# Patient Record
Sex: Female | Born: 1935 | Race: White | Hispanic: No | Marital: Married | Smoking: Never smoker
Health system: Southern US, Community
[De-identification: ages and names within clinical notes are randomized; demographics above are authoritative.]

## PROBLEM LIST (undated history)

## (undated) DIAGNOSIS — C801 Malignant (primary) neoplasm, unspecified: Secondary | ICD-10-CM

## (undated) DIAGNOSIS — C786 Secondary malignant neoplasm of retroperitoneum and peritoneum: Secondary | ICD-10-CM

## (undated) DIAGNOSIS — I10 Essential (primary) hypertension: Secondary | ICD-10-CM

## (undated) HISTORY — PX: CHOLECYSTECTOMY: SHX55

## (undated) HISTORY — DX: Secondary malignant neoplasm of retroperitoneum and peritoneum: C78.6

## (undated) HISTORY — PX: ABDOMINAL HYSTERECTOMY: SHX81

## (undated) HISTORY — DX: Malignant (primary) neoplasm, unspecified: C80.1

---

## 2001-12-12 ENCOUNTER — Ambulatory Visit (HOSPITAL_COMMUNITY): Admission: RE | Admit: 2001-12-12 | Discharge: 2001-12-12 | Payer: Self-pay | Admitting: Internal Medicine

## 2001-12-12 ENCOUNTER — Encounter: Payer: Self-pay | Admitting: Internal Medicine

## 2002-11-02 ENCOUNTER — Inpatient Hospital Stay (HOSPITAL_COMMUNITY): Admission: RE | Admit: 2002-11-02 | Discharge: 2002-11-03 | Payer: Self-pay | Admitting: Internal Medicine

## 2003-12-05 ENCOUNTER — Ambulatory Visit (HOSPITAL_COMMUNITY): Admission: RE | Admit: 2003-12-05 | Discharge: 2003-12-05 | Payer: Self-pay | Admitting: Internal Medicine

## 2005-05-29 ENCOUNTER — Ambulatory Visit (HOSPITAL_COMMUNITY): Admission: RE | Admit: 2005-05-29 | Discharge: 2005-05-29 | Payer: Self-pay | Admitting: Family Medicine

## 2006-05-31 ENCOUNTER — Ambulatory Visit (HOSPITAL_COMMUNITY): Admission: RE | Admit: 2006-05-31 | Discharge: 2006-05-31 | Payer: Self-pay | Admitting: Internal Medicine

## 2006-10-26 ENCOUNTER — Ambulatory Visit (HOSPITAL_COMMUNITY): Admission: RE | Admit: 2006-10-26 | Discharge: 2006-10-26 | Payer: Self-pay | Admitting: Internal Medicine

## 2006-11-08 ENCOUNTER — Ambulatory Visit: Payer: Self-pay | Admitting: Internal Medicine

## 2006-11-08 ENCOUNTER — Encounter (INDEPENDENT_AMBULATORY_CARE_PROVIDER_SITE_OTHER): Payer: Self-pay | Admitting: *Deleted

## 2006-11-08 ENCOUNTER — Observation Stay (HOSPITAL_COMMUNITY): Admission: RE | Admit: 2006-11-08 | Discharge: 2006-11-09 | Payer: Self-pay | Admitting: Internal Medicine

## 2006-12-15 ENCOUNTER — Ambulatory Visit (HOSPITAL_COMMUNITY): Admission: RE | Admit: 2006-12-15 | Discharge: 2006-12-15 | Payer: Self-pay | Admitting: General Surgery

## 2007-06-02 ENCOUNTER — Ambulatory Visit (HOSPITAL_COMMUNITY): Admission: RE | Admit: 2007-06-02 | Discharge: 2007-06-02 | Payer: Self-pay | Admitting: Internal Medicine

## 2007-12-20 ENCOUNTER — Ambulatory Visit (HOSPITAL_COMMUNITY): Admission: RE | Admit: 2007-12-20 | Discharge: 2007-12-20 | Payer: Self-pay | Admitting: Ophthalmology

## 2008-01-03 ENCOUNTER — Ambulatory Visit (HOSPITAL_COMMUNITY): Admission: RE | Admit: 2008-01-03 | Discharge: 2008-01-03 | Payer: Self-pay | Admitting: Ophthalmology

## 2008-06-05 ENCOUNTER — Ambulatory Visit (HOSPITAL_COMMUNITY): Admission: RE | Admit: 2008-06-05 | Discharge: 2008-06-05 | Payer: Self-pay | Admitting: Internal Medicine

## 2010-04-24 ENCOUNTER — Ambulatory Visit (HOSPITAL_COMMUNITY): Admission: RE | Admit: 2010-04-24 | Discharge: 2010-04-24 | Payer: Self-pay | Admitting: Internal Medicine

## 2010-11-20 ENCOUNTER — Other Ambulatory Visit (HOSPITAL_COMMUNITY): Payer: Self-pay | Admitting: Internal Medicine

## 2010-11-20 DIAGNOSIS — Z139 Encounter for screening, unspecified: Secondary | ICD-10-CM

## 2010-11-25 ENCOUNTER — Ambulatory Visit (HOSPITAL_COMMUNITY)
Admission: RE | Admit: 2010-11-25 | Discharge: 2010-11-25 | Disposition: A | Discharge status: Home / Self Care | Payer: Medicare Other | Source: Ambulatory Visit | Attending: Internal Medicine | Admitting: Internal Medicine

## 2010-11-25 DIAGNOSIS — Z139 Encounter for screening, unspecified: Secondary | ICD-10-CM

## 2010-11-25 DIAGNOSIS — M818 Other osteoporosis without current pathological fracture: Secondary | ICD-10-CM | POA: Insufficient documentation

## 2010-12-26 NOTE — H&P (Signed)
NAME:  Shelia Vaughn, Shelia Vaughn               ACCOUNT NO.:  [id number]   MEDICAL RECORD NO.:  [id number]          PATIENT TYPE:  AMB   LOCATION:  DAY                           FACILITY:  [facility]   PHYSICIAN:  Dalia Heading, M.D.  DATE OF BIRTH:  Jan 16, 1927   DATE OF ADMISSION:  DATE OF DISCHARGE:  LH                              HISTORY & PHYSICAL   CHIEF COMPLAINT:  Cholecystitis, cholelithiasis.   HISTORY OF PRESENT ILLNESS:  The patient is a 77 year old white female  who is referred for evaluation and treatment of biliary colic secondary  to cholelithiasis.  She has been having intermittent right upper  quadrant abdominal pain, nausea, and bloating for many weeks.  She does  have fatty food intolerance.  No fever, chills, or jaundice have been  noted.   PAST MEDICAL HISTORY:  1. High cholesterol levels.  2. Hypertension.   PAST SURGICAL HISTORY:  1. Hysterectomy.  2. Bladder tack up.   CURRENT MEDICATIONS:  Lipitor, a blood pressure pill.   ALLERGIES:  NO KNOWN DRUG ALLERGIES.   REVIEW OF SYSTEMS:  The patient denies drinking or smoking.  She denies  any recent chest pain, MI, CVA, or diabetes mellitus.   PHYSICAL EXAMINATION:  GENERAL:  The patient is a well-developed, well-  nourished, white female in no acute distress.  HEENT:  Reveals no scleral icterus.  LUNGS:  Clear to auscultation with equal breath sounds bilaterally.  HEART:  Reveals a regular rate and rhythm without S3, S4, or murmurs.  ABDOMEN:  Soft and nondistended.  She is tender in the right upper  quadrant to palpation.  No hepatosplenomegaly, masses, or hernias are  identified.   Ultrasound of the gallbladder reveals cholelithiasis with a dilated  common bile duct to 10-mm, but no choledocholithiasis is seen.   IMPRESSION:  1. Cholecystitis.  2. Cholelithiasis.   PLAN:  The patient is scheduled for a laparoscopic cholecystectomy with  cholangiograms on November 08, 2006.  The risks and benefits of the  procedure including bleeding, infection, hepatobiliary injury, and the  possibly of needing an ERCP, the possibility of an open procedure were  fully explained to the patient, gave informed consent.      Dalia Heading, M.D.  Electronically Signed     MAJ/MEDQ  D:  11/01/2006  T:  11/01/2006  Job:  [id number]   cc:   [facility]  Fax: [phone]   Catalina Pizza, M.D.  Fax: [phone]

## 2010-12-26 NOTE — Discharge Summary (Signed)
   NAME:  RAIYAH, SPEAKMAN                         ACCOUNT NO.:  [id number]   MEDICAL RECORD NO.:  [id number]                   PATIENT TYPE:  INP   LOCATION:  A302                                 FACILITY:  [facility]   PHYSICIAN:  Bernerd Limbo. Leona Carry, M.D.             DATE OF BIRTH:  Sep 30, 1926   DATE OF ADMISSION:  11/02/2002  DATE OF DISCHARGE:  11/03/2002                                 DISCHARGE SUMMARY   ADMITTING DIAGNOSES:  Cystocele.   DISCHARGE DIAGNOSES:  Cystocele.   OPERATION PERFORMED:  March 25 anterior repair.   COMPLICATIONS:  None.   PROGNOSIS:  Good.   HISTORY OF PRESENT ILLNESS:  This 77 year old white female was admitted to  this hospital on the 25th of March with a complaint of a lot of pelvic  pressure and when she voided she had to push the bladder back inside.  There  was no stress incontinence; however, there was frequency and increased  nocturia.  Examination in the office did reveal moderate grade 2 cystocele.   HOSPITAL COURSE:  On the 25th of March this patient was taken to the  operating room and anterior repair was performed without event.  Postoperatively she did very well.  Foley and packing removed on the 26th of  March.  She was voiding without difficulty.  She was discharged home on this  date to be seen in one week.                                               Bernerd Limbo. Leona Carry, M.D.    NMD/MEDQ  D:  11/23/2002  T:  11/23/2002  Job:  [id number]

## 2010-12-26 NOTE — Procedures (Signed)
   NAME:  Shelia Vaughn, Shelia Vaughn                         ACCOUNT NO.:  [id number]   MEDICAL RECORD NO.:  [id number]                   PATIENT TYPE:  AMB   LOCATION:  DAY                                  FACILITY:  [facility]   PHYSICIAN:  Vida Roller, M.D.                DATE OF BIRTH:  [birth year]   DATE OF PROCEDURE:  DATE OF DISCHARGE:                                EKG INTERPRETATION   REFERRING PHYSICIAN:  Bernerd Limbo. Leona Carry, M.D.   INTERPRETATION:  Electrocardiogram was performed on October 31, 2002.  Results:  Normal sinus rhythm at a rate of 73 with normal intervals.  There  is evidence of left axis deviation and left anterior fascicular block.  The  overall R-wave progression is poor across the anterior precordium, but there  is no evidence of Q-waves or ST-segment changes consistent with ischemia.  There are no old EKGs for comparison.  There is borderline left atrial  enlargement as well.                                               Vida Roller, M.D.    JH/MEDQ  D:  10/31/2002  T:  10/31/2002  Job:  [id number]

## 2010-12-26 NOTE — Op Note (Signed)
NAMESHAWNI, Shelia Vaughn               ACCOUNT NO.:  [id number]   MEDICAL RECORD NO.:  [id number]          PATIENT TYPE:  OBV   LOCATION:  A210                          FACILITY:  [facility]   PHYSICIAN:  Dalia Heading, M.D.  DATE OF BIRTH:  23-Apr-1927   DATE OF PROCEDURE:  11/08/2006  DATE OF DISCHARGE:  11/09/2006                               OPERATIVE REPORT   PREOPERATIVE DIAGNOSIS:  Cholecystitis, cholelithiasis, dilated common  bile duct.   POSTOPERATIVE DIAGNOSIS:  Cholecystitis, cholelithiasis, dilated common  bile duct.   PROCEDURE:  Laparoscopic cholecystectomy with cholangiograms.   SURGEON:  Dr. Franky Macho.   ANESTHESIA:  General endotracheal.   INDICATIONS:  The patient is a 77 year old white female presents with  cholecystitis with cholelithiasis.  She is also noted to have a dilated  common bile duct to 10 mm with no common duct stones seen.  The patient  now comes to the operating room for laparoscopic cholecystectomy with  cholangiograms.  Risks and benefits of the procedure including bleeding,  infection, hepatobiliary injury, the possibly an open procedure were  fully explained to the patient, who gave informed consent.   PROCEDURE NOTE:  The patient was placed in supine position.  After  induction of general endotracheal anesthesia, the abdomen was prepped  and draped in the usual sterile technique with Betadine.  Surgical site  confirmation was performed.   An infraumbilical incision was made down to fascia.  Veress needle was  introduced into the abdominal cavity and confirmation of placement was  done using the saline drop test.  The abdomen was then insufflated to 16  mmHg pressure.  An 11-mm trocar was introduced into the abdominal cavity  under direct visualization without difficulty.  The patient was placed  in reverse Trendelenburg position.  An additional 11-mm trocar was  placed in epigastric region and 5-mm trocars placed in the right upper  quadrant and right flank regions.  Liver was inspected and noted to be  within normal limits.  Gallbladder was retracted superior and laterally.  The dissection was begun around the infundibulum of the gallbladder.  The cystic duct was first identified.  Its juncture to the infundibulum  fully identified.  A single Endo clip was placed proximally on the  cystic duct.  An incision was made into the cystic duct and  cholangiogram catheter was inserted.  Cholangiograms were then performed  under digital fluoroscopy.  The patient was noted to have a diffusely  dilated, hepatobiliary tree with narrowing at the distal portion of the  common bile duct.  Dye did flow into the duodenum.  No filling defects  were noted within the hepatobiliary tree.  The system was then flushed  with normal saline and the cholangiocatheter removed.  A vascular Endo-  GIA was placed across the cystic duct and fired.  The cystic artery was  ligated and divided.  The gallbladder was then freed away from the  gallbladder fossa using Bovie electrocautery.  The gallbladder delivered  through the epigastric trocar site using EndoCatch bag.  Multiple large  stones were noted within the gallbladder lumen.  The gallbladder fossa  was inspected.  No abnormal bleeding or bile leakage was noted.  Surgicel was placed in the gallbladder fossa.  All fluid and air were  then evacuated from the abdominal cavity prior to removal of the  trocars.   All wounds were irrigated normal saline.  All wounds were checked with  0.5 cm Sensorcaine.  The infraumbilical fascia was reapproximated using  a 0-0 Vicryl interrupted suture.  All skin incisions were closed using  staples.  Betadine ointment and dry sterile dressings were applied.   All tape and needle counts correct and end the procedure.  The patient  was extubated in the operating room and went back to recovery room awake  in stable condition.   COMPLICATIONS:  None.   SPECIMEN:   Gallbladder with stones.   BLOOD LOSS:  Minimal.      Dalia Heading, M.D.  Electronically Signed     MAJ/MEDQ  D:  11/08/2006  T:  11/08/2006  Job:  [id number]   cc:   Catalina Pizza, M.D.  Fax: [phone]
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
## 2010-12-26 NOTE — Op Note (Signed)
   NAME:  Shelia Vaughn, LEITZKE                         ACCOUNT NO.:  [id number]   MEDICAL RECORD NO.:  [id number]                   PATIENT TYPE:  AMB   LOCATION:  DAY                                  FACILITY:  [facility]   PHYSICIAN:  Bernerd Limbo. Leona Carry, M.D.             DATE OF BIRTH:  [birth year]   DATE OF PROCEDURE:  11/02/2002  DATE OF DISCHARGE:                                 OPERATIVE REPORT   PREOPERATIVE DIAGNOSIS:  Cystocele.   POSTOPERATIVE DIAGNOSIS:  Cystocele.   PROCEDURE:  Anterior repair.   COMPLICATIONS:  None.   SURGEON:  Bernerd Limbo. Destefano, M.D.   DESCRIPTION OF PROCEDURE:  Under adequate general anesthesia, patient  prepped and draped in the lithotomy position.  A weighted vaginal speculum  was inserted into the vagina and the anterior vaginal wall, which contained  a cystocele second degree, was grasped with Allis clamps and incised.  Dissection was carried upward to the level of the urethra, the bladder and  pubocervical fascia pushed out of harm's way, and inferiorly down to the  level of the vaginal cuff.  After the bladder pushed a sufficient distance  out of harm's way, the pubocervical fascia was reapproximated with  continuous locking 0 chromic catgut and then oversewn with interrupted 0  Vicryl.  The excess vaginal wall was excised and the anterior vaginal wall  then repaired first with a continuous locking 0 chromic catgut suture and  then oversewn with interrupted 0 Vicryl sutures.  At completion of the  procedure, all bleeding was under control.  Blood loss estimated at maybe 15  or 20 mL.  A two-inch five-yard iodoform gauze pack was inserted into the  vagina.  A Foley catheter was left in place, to be removed in the a.m.  The  urine was clear at the end of the surgery.  The patient tolerated this  procedure nicely and left the room in good condition.                                               Bernerd Limbo. Leona Carry, M.D.    NMD/MEDQ  D:  11/02/2002  T:   11/03/2002  Job:  [id number]
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
## 2011-05-06 LAB — BASIC METABOLIC PANEL
Creatinine, Ser: 0.68
GFR calc Af Amer: >60
GFR calc non Af Amer: >60
Potassium: 4

## 2011-05-06 LAB — HEMOGLOBIN AND HEMATOCRIT, BLOOD
HCT: 36.8
Hemoglobin: 12.9

## 2011-11-11 ENCOUNTER — Other Ambulatory Visit (HOSPITAL_COMMUNITY): Payer: Self-pay | Admitting: Internal Medicine

## 2011-11-11 ENCOUNTER — Ambulatory Visit (HOSPITAL_COMMUNITY)
Admission: RE | Admit: 2011-11-11 | Discharge: 2011-11-11 | Disposition: A | Discharge status: Home / Self Care | Payer: Medicare Other | Source: Ambulatory Visit | Attending: Internal Medicine | Admitting: Internal Medicine

## 2011-11-11 DIAGNOSIS — R0781 Pleurodynia: Secondary | ICD-10-CM

## 2011-11-11 DIAGNOSIS — R071 Chest pain on breathing: Secondary | ICD-10-CM | POA: Insufficient documentation

## 2011-11-11 DIAGNOSIS — I517 Cardiomegaly: Secondary | ICD-10-CM | POA: Insufficient documentation

## 2011-12-29 ENCOUNTER — Other Ambulatory Visit (HOSPITAL_COMMUNITY): Payer: Self-pay | Admitting: Internal Medicine

## 2011-12-29 ENCOUNTER — Encounter (HOSPITAL_COMMUNITY): Payer: Self-pay

## 2011-12-29 ENCOUNTER — Ambulatory Visit (HOSPITAL_COMMUNITY)
Admission: RE | Admit: 2011-12-29 | Discharge: 2011-12-29 | Disposition: A | Discharge status: Home / Self Care | Payer: Medicare Other | Source: Ambulatory Visit | Attending: Internal Medicine | Admitting: Internal Medicine

## 2011-12-29 DIAGNOSIS — R197 Diarrhea, unspecified: Secondary | ICD-10-CM

## 2011-12-29 DIAGNOSIS — K5730 Diverticulosis of large intestine without perforation or abscess without bleeding: Secondary | ICD-10-CM | POA: Insufficient documentation

## 2011-12-29 HISTORY — DX: Essential (primary) hypertension: I10

## 2012-03-29 ENCOUNTER — Other Ambulatory Visit: Payer: Self-pay | Admitting: Obstetrics & Gynecology

## 2013-01-20 ENCOUNTER — Other Ambulatory Visit (HOSPITAL_COMMUNITY): Payer: Self-pay | Admitting: Internal Medicine

## 2013-01-20 DIAGNOSIS — M810 Age-related osteoporosis without current pathological fracture: Secondary | ICD-10-CM

## 2013-01-24 ENCOUNTER — Ambulatory Visit (HOSPITAL_COMMUNITY)
Admission: RE | Admit: 2013-01-24 | Discharge: 2013-01-24 | Disposition: A | Discharge status: Home / Self Care | Payer: Medicare Other | Source: Ambulatory Visit | Attending: Internal Medicine | Admitting: Internal Medicine

## 2013-01-24 DIAGNOSIS — M818 Other osteoporosis without current pathological fracture: Secondary | ICD-10-CM | POA: Insufficient documentation

## 2013-01-24 DIAGNOSIS — M810 Age-related osteoporosis without current pathological fracture: Secondary | ICD-10-CM

## 2014-10-23 DIAGNOSIS — H524 Presbyopia: Secondary | ICD-10-CM | POA: Diagnosis not present

## 2014-10-23 DIAGNOSIS — Z961 Presence of intraocular lens: Secondary | ICD-10-CM | POA: Diagnosis not present

## 2014-10-23 DIAGNOSIS — H4011X1 Primary open-angle glaucoma, mild stage: Secondary | ICD-10-CM | POA: Diagnosis not present

## 2014-10-23 DIAGNOSIS — H52203 Unspecified astigmatism, bilateral: Secondary | ICD-10-CM | POA: Diagnosis not present

## 2015-01-23 DIAGNOSIS — E782 Mixed hyperlipidemia: Secondary | ICD-10-CM | POA: Diagnosis not present

## 2015-01-23 DIAGNOSIS — I10 Essential (primary) hypertension: Secondary | ICD-10-CM | POA: Diagnosis not present

## 2015-01-25 DIAGNOSIS — I10 Essential (primary) hypertension: Secondary | ICD-10-CM | POA: Diagnosis not present

## 2015-01-25 DIAGNOSIS — E782 Mixed hyperlipidemia: Secondary | ICD-10-CM | POA: Diagnosis not present

## 2015-02-19 DIAGNOSIS — H4011X1 Primary open-angle glaucoma, mild stage: Secondary | ICD-10-CM | POA: Diagnosis not present

## 2015-05-30 DIAGNOSIS — Z23 Encounter for immunization: Secondary | ICD-10-CM | POA: Diagnosis not present

## 2015-07-29 DIAGNOSIS — M810 Age-related osteoporosis without current pathological fracture: Secondary | ICD-10-CM | POA: Diagnosis not present

## 2015-07-29 DIAGNOSIS — I10 Essential (primary) hypertension: Secondary | ICD-10-CM | POA: Diagnosis not present

## 2015-07-29 DIAGNOSIS — E782 Mixed hyperlipidemia: Secondary | ICD-10-CM | POA: Diagnosis not present

## 2015-08-01 DIAGNOSIS — I10 Essential (primary) hypertension: Secondary | ICD-10-CM | POA: Diagnosis not present

## 2015-08-01 DIAGNOSIS — S93402A Sprain of unspecified ligament of left ankle, initial encounter: Secondary | ICD-10-CM | POA: Diagnosis not present

## 2015-08-01 DIAGNOSIS — E782 Mixed hyperlipidemia: Secondary | ICD-10-CM | POA: Diagnosis not present

## 2015-10-31 DIAGNOSIS — R0981 Nasal congestion: Secondary | ICD-10-CM | POA: Diagnosis not present

## 2015-10-31 DIAGNOSIS — H6503 Acute serous otitis media, bilateral: Secondary | ICD-10-CM | POA: Diagnosis not present

## 2016-01-07 DIAGNOSIS — R0981 Nasal congestion: Secondary | ICD-10-CM | POA: Diagnosis not present

## 2016-01-07 DIAGNOSIS — H6503 Acute serous otitis media, bilateral: Secondary | ICD-10-CM | POA: Diagnosis not present

## 2016-02-03 DIAGNOSIS — E782 Mixed hyperlipidemia: Secondary | ICD-10-CM | POA: Diagnosis not present

## 2016-02-03 DIAGNOSIS — I10 Essential (primary) hypertension: Secondary | ICD-10-CM | POA: Diagnosis not present

## 2016-02-05 DIAGNOSIS — R0981 Nasal congestion: Secondary | ICD-10-CM | POA: Diagnosis not present

## 2016-02-05 DIAGNOSIS — H6503 Acute serous otitis media, bilateral: Secondary | ICD-10-CM | POA: Diagnosis not present

## 2016-02-05 DIAGNOSIS — H906 Mixed conductive and sensorineural hearing loss, bilateral: Secondary | ICD-10-CM | POA: Diagnosis not present

## 2016-03-24 DIAGNOSIS — M79672 Pain in left foot: Secondary | ICD-10-CM | POA: Diagnosis not present

## 2016-06-02 DIAGNOSIS — Z23 Encounter for immunization: Secondary | ICD-10-CM | POA: Diagnosis not present

## 2016-06-30 DIAGNOSIS — H52203 Unspecified astigmatism, bilateral: Secondary | ICD-10-CM | POA: Diagnosis not present

## 2016-06-30 DIAGNOSIS — H5213 Myopia, bilateral: Secondary | ICD-10-CM | POA: Diagnosis not present

## 2016-06-30 DIAGNOSIS — Z961 Presence of intraocular lens: Secondary | ICD-10-CM | POA: Diagnosis not present

## 2016-06-30 DIAGNOSIS — H524 Presbyopia: Secondary | ICD-10-CM | POA: Diagnosis not present

## 2016-06-30 DIAGNOSIS — H401131 Primary open-angle glaucoma, bilateral, mild stage: Secondary | ICD-10-CM | POA: Diagnosis not present

## 2016-08-05 DIAGNOSIS — E782 Mixed hyperlipidemia: Secondary | ICD-10-CM | POA: Diagnosis not present

## 2016-08-05 DIAGNOSIS — I10 Essential (primary) hypertension: Secondary | ICD-10-CM | POA: Diagnosis not present

## 2016-08-07 DIAGNOSIS — E782 Mixed hyperlipidemia: Secondary | ICD-10-CM | POA: Diagnosis not present

## 2016-08-07 DIAGNOSIS — Z0001 Encounter for general adult medical examination with abnormal findings: Secondary | ICD-10-CM | POA: Diagnosis not present

## 2016-08-07 DIAGNOSIS — I10 Essential (primary) hypertension: Secondary | ICD-10-CM | POA: Diagnosis not present

## 2016-10-06 DIAGNOSIS — H401131 Primary open-angle glaucoma, bilateral, mild stage: Secondary | ICD-10-CM | POA: Diagnosis not present

## 2017-02-02 DIAGNOSIS — E782 Mixed hyperlipidemia: Secondary | ICD-10-CM | POA: Diagnosis not present

## 2017-02-02 DIAGNOSIS — I10 Essential (primary) hypertension: Secondary | ICD-10-CM | POA: Diagnosis not present

## 2017-04-13 DIAGNOSIS — I10 Essential (primary) hypertension: Secondary | ICD-10-CM | POA: Diagnosis not present

## 2017-04-13 DIAGNOSIS — R110 Nausea: Secondary | ICD-10-CM | POA: Diagnosis not present

## 2017-04-13 DIAGNOSIS — R197 Diarrhea, unspecified: Secondary | ICD-10-CM | POA: Diagnosis not present

## 2017-04-19 ENCOUNTER — Other Ambulatory Visit (HOSPITAL_COMMUNITY): Payer: Self-pay | Admitting: Internal Medicine

## 2017-04-19 ENCOUNTER — Ambulatory Visit (HOSPITAL_COMMUNITY)
Admission: RE | Admit: 2017-04-19 | Discharge: 2017-04-19 | Disposition: A | Discharge status: Home / Self Care | Payer: Medicare Other | Source: Ambulatory Visit | Attending: Internal Medicine | Admitting: Internal Medicine

## 2017-04-19 DIAGNOSIS — R932 Abnormal findings on diagnostic imaging of liver and biliary tract: Secondary | ICD-10-CM | POA: Diagnosis not present

## 2017-04-19 DIAGNOSIS — R188 Other ascites: Secondary | ICD-10-CM | POA: Insufficient documentation

## 2017-04-19 DIAGNOSIS — R109 Unspecified abdominal pain: Secondary | ICD-10-CM | POA: Insufficient documentation

## 2017-04-19 DIAGNOSIS — J90 Pleural effusion, not elsewhere classified: Secondary | ICD-10-CM | POA: Diagnosis not present

## 2017-04-19 DIAGNOSIS — R110 Nausea: Secondary | ICD-10-CM | POA: Diagnosis not present

## 2017-04-19 DIAGNOSIS — I10 Essential (primary) hypertension: Secondary | ICD-10-CM | POA: Diagnosis not present

## 2017-04-19 DIAGNOSIS — R197 Diarrhea, unspecified: Secondary | ICD-10-CM | POA: Diagnosis not present

## 2017-04-19 DIAGNOSIS — D4989 Neoplasm of unspecified behavior of other specified sites: Secondary | ICD-10-CM | POA: Insufficient documentation

## 2017-04-19 LAB — POCT I-STAT CREATININE: CREATININE: 1 mg/dL (ref 0.44–1.00)

## 2017-04-19 MED ORDER — IOPAMIDOL (ISOVUE-300) INJECTION 61%
100.0000 mL | Freq: Once | INTRAVENOUS | Status: AC | PRN
  Administered 2017-04-19: 100 mL via INTRAVENOUS

## 2017-04-20 DIAGNOSIS — J90 Pleural effusion, not elsewhere classified: Secondary | ICD-10-CM | POA: Diagnosis not present

## 2017-04-20 DIAGNOSIS — R1907 Generalized intra-abdominal and pelvic swelling, mass and lump: Secondary | ICD-10-CM | POA: Diagnosis not present

## 2017-04-20 DIAGNOSIS — R110 Nausea: Secondary | ICD-10-CM | POA: Diagnosis not present

## 2017-04-20 DIAGNOSIS — R1084 Generalized abdominal pain: Secondary | ICD-10-CM | POA: Diagnosis not present

## 2017-04-21 ENCOUNTER — Ambulatory Visit (HOSPITAL_COMMUNITY)
Admission: RE | Admit: 2017-04-21 | Discharge: 2017-04-21 | Disposition: A | Discharge status: Home / Self Care | Payer: Medicare Other | Source: Ambulatory Visit | Attending: Oncology | Admitting: Oncology

## 2017-04-21 ENCOUNTER — Other Ambulatory Visit (HOSPITAL_COMMUNITY): Payer: Self-pay | Admitting: Oncology

## 2017-04-21 ENCOUNTER — Encounter (HOSPITAL_COMMUNITY): Payer: Self-pay

## 2017-04-21 ENCOUNTER — Encounter (HOSPITAL_COMMUNITY): Payer: Medicare Other

## 2017-04-21 ENCOUNTER — Encounter (HOSPITAL_COMMUNITY): Payer: Medicare Other | Attending: Oncology | Admitting: Oncology

## 2017-04-21 DIAGNOSIS — R911 Solitary pulmonary nodule: Secondary | ICD-10-CM

## 2017-04-21 DIAGNOSIS — J984 Other disorders of lung: Secondary | ICD-10-CM | POA: Insufficient documentation

## 2017-04-21 DIAGNOSIS — M4854XA Collapsed vertebra, not elsewhere classified, thoracic region, initial encounter for fracture: Secondary | ICD-10-CM | POA: Insufficient documentation

## 2017-04-21 DIAGNOSIS — J479 Bronchiectasis, uncomplicated: Secondary | ICD-10-CM | POA: Insufficient documentation

## 2017-04-21 DIAGNOSIS — C801 Malignant (primary) neoplasm, unspecified: Secondary | ICD-10-CM | POA: Diagnosis not present

## 2017-04-21 DIAGNOSIS — J90 Pleural effusion, not elsewhere classified: Secondary | ICD-10-CM

## 2017-04-21 DIAGNOSIS — R599 Enlarged lymph nodes, unspecified: Secondary | ICD-10-CM | POA: Diagnosis not present

## 2017-04-21 DIAGNOSIS — C786 Secondary malignant neoplasm of retroperitoneum and peritoneum: Secondary | ICD-10-CM

## 2017-04-21 DIAGNOSIS — R188 Other ascites: Secondary | ICD-10-CM | POA: Insufficient documentation

## 2017-04-21 DIAGNOSIS — R110 Nausea: Secondary | ICD-10-CM | POA: Diagnosis not present

## 2017-04-21 DIAGNOSIS — R1900 Intra-abdominal and pelvic swelling, mass and lump, unspecified site: Secondary | ICD-10-CM

## 2017-04-21 DIAGNOSIS — R109 Unspecified abdominal pain: Secondary | ICD-10-CM | POA: Diagnosis not present

## 2017-04-21 HISTORY — DX: Secondary malignant neoplasm of retroperitoneum and peritoneum: C78.6

## 2017-04-21 LAB — POCT I-STAT CREATININE: Creatinine, Ser: 1.6 mg/dL — ABNORMAL HIGH (ref 0.44–1.00)

## 2017-04-21 MED ORDER — IOPAMIDOL (ISOVUE-300) INJECTION 61%: 75.0000 mL | Freq: Once | INTRAVENOUS | Status: DC | PRN

## 2017-04-21 NOTE — Patient Instructions (Addendum)
[place] [facility] Discharge Instructions  RECOMMENDATIONS MADE BY THE CONSULTANT AND ANY TEST RESULTS WILL BE SENT TO YOUR REFERRING PHYSICIAN.  You were seen today by Dr. Twana First We will refer you to Dr. Denman George in [place] We will get you scheduled for STAT CT scan Lab work today We will call you with the results.   Thank you for choosing [place] [facility] to provide your oncology and hematology care.  To afford each patient quality time with our provider, please arrive at least 15 minutes before your scheduled appointment time.    If you have a lab appointment with the [facility] please come in thru the  Main Entrance and check in at the main information desk  You need to re-schedule your appointment should you arrive 10 or more minutes late.  We strive to give you quality time with our providers, and arriving late affects you and other patients whose appointments are after yours.  Also, if you no show three or more times for appointments you may be dismissed from the clinic at the providers discretion.     Again, thank you for choosing [facility]  Our hope is that these requests will decrease the amount of time that you wait before being seen by our physicians.       _____________________________________________________________  Should you have questions after your visit to [facility], please contact our office at [phone] between the hours of 8:30 a.m. and 4:30 p.m.  Voicemails left after 4:30 p.m. will not be returned until the following business day.  For prescription refill requests, have your pharmacy contact our office.       Resources For Cancer Patients and their Caregivers ? American Cancer Society: Can assist with transportation, wigs, general needs, runs Look Good Feel Better.        201-527-8780 ? Cancer Care: Provides financial assistance, online support groups,  medication/co-pay assistance.  1-800-813-HOPE (306)184-5131) ? [place] Assists [place] cancer patients and their families through emotional , educational and financial support.  403-614-3053 ? [place] DSS Where to apply for food stamps, Medicaid and utility assistance. (918)222-5307 ? [facility]: Transportation to medical appointments. (984)108-9697 ? Social Security Administration: May apply for disability if have a Stage IV cancer. 7702111000 971-332-4965 ? [employer], Disability and Transit Services: Assists with nutrition, care and transit needs. [place] Support Programs: @10RELATIVEDAYS @ > Cancer Support Group  2nd Tuesday of the month 1pm-2pm, Journey Room  > Creative Journey  3rd Tuesday of the month 1130am-1pm, Journey Room  > Look Good Feel Better  1st Wednesday of the month 10am-12 noon, Journey Room (Call [place] to register [phone])

## 2017-04-21 NOTE — Progress Notes (Signed)
[facility] Initial Visit:  Patient Care Team: Celene Squibb, MD as PCP - General (Internal Medicine)  CHIEF COMPLAINTS/PURPOSE OF CONSULTATION:  Abdominal mass/peritoneal carcinomatosis  HISTORY OF PRESENTING ILLNESS: Shelia Vaughn 83 y.o. female presents with her family today for large abdominal mass. Patient had been having left-sided abdominal pain as well as nausea for the past 3 weeks. She went to see her primary care Dr. Nevada Crane who ordered a CT abdomen pelvis with contrast on 04/19/2017 which demonstrated a large abdominal mesenteric mass with a maximum dimension of 16.8 cm and involves the portal venous confluence and superior mesenteric vein, ascites and diffuse peritoneal nodularity with omental caking compatible with carcinomatosis, evidence of thoracic involvement with bilateral pleural effusions and a soft tissue nodularity overlying the right lung as well as enlarged left cardiophrenic lymph nodes. There was also dilatation and intrahepatic bile ducts which may reflect mass effect upon the distal common bile duct from the large mesenteric mass.   Patient states that she previously had a hysterectomy in her 60s and thinks she had her ovaries removed but is not completely sure. She has been having decreased appetite for the past few weeks due to nausea every time she eats. She's also had uncontrollable diarrhea for the past one week. She feels very fatigued and has no energy. Patient was given Zofran 4 mg by mouth every 6 hours for her nausea by her primary care physician. She also is has intermittent abdominal pain for which she takes Aleve and she states it controls her symptoms.  Review of Systems - Oncology ROS as per HPI otherwise 12 point ROS is negative.  MEDICAL HISTORY: Past Medical History:  Diagnosis Date  . Hypertension   . 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  ABDOMINAL HYSTERECTOMY    . CHOLECYSTECTOMY      SOCIAL HISTORY: Social History   Social History  . Marital status: Married    Spouse name: N/A  . Number of children: N/A  . Years of education: N/A   Occupational History  . Not on file.   Social History Main Topics  . Smoking status: Never Smoker  . Smokeless tobacco: Never Used  . Alcohol use No     Comment: drank in her earlier years socially  . Drug use: No  . Sexual activity: No   Other Topics Concern  . Not on file   Social History Narrative  . No narrative on file    FAMILY HISTORY History reviewed. No pertinent family history.  ALLERGIES:  is allergic to latex.  MEDICATIONS:  Current Outpatient Prescriptions  Medication Sig Dispense Refill  . Alendronate Sodium (FOSAMAX PO) Take 1 tablet by mouth every 30 (thirty) days.    Marland Kitchen amLODipine (NORVASC) 5 MG tablet     . BIOTIN PO Take 1 tablet by mouth daily.    Marland Kitchen CALCIUM PO Take 1 tablet by mouth daily.    . cholecalciferol (VITAMIN D) 1000 units tablet Take 1,000 Units by mouth daily.    Marland Kitchen GLUCOSAMINE-CHONDROITIN PO Take 1 tablet by mouth daily.    . nebivolol (BYSTOLIC) 5 MG tablet     . Omega-3 Fatty Acids (FISH OIL PO) Take 1 tablet by mouth daily.    Marland Kitchen ONDANSETRON PO Take 1 tablet by mouth as needed.    . Probiotic Product (PROBIOTIC PO) Take 1 tablet by mouth daily.    . risedronate (ACTONEL)  150 MG tablet     . telmisartan (MICARDIS) 80 MG tablet     . Travoprost, BAK Free, (TRAVATAN Z) 0.004 % SOLN ophthalmic solution INSTILL ONE DROP INTO BOTH EYES AT BEDTIME AS DIRECTED    . VITAMIN E PO Take 1 tablet by mouth daily.     No current facility-administered medications for this visit.     PHYSICAL EXAMINATION:  ECOG PERFORMANCE STATUS: 1 - Symptomatic but completely ambulatory   Vitals:   04/21/17 1405  BP: (!) 128/58  Pulse: 74  Resp: 16  Temp: 98.8 F (37.1 C)  SpO2: 97%    Filed Weights   04/21/17 1405  Weight: 142 lb 6.4 oz (64.6  kg)     Physical Exam Constitutional: Well-developed, well-nourished, and in no distress, appears younger than stated age HENT:  Head: Normocephalic and atraumatic. Hard of hearing and wears hearing aid. Mouth/Throat: No oropharyngeal exudate. Mucosa moist. Eyes: Pupils are equal, round, and reactive to light. Conjunctivae are normal. No scleral icterus.  Neck: Normal range of motion. Neck supple. No JVD present.  Cardiovascular: Normal rate, regular rhythm and normal heart sounds.  Exam reveals no gallop and no friction rub.   No murmur heard. Pulmonary/Chest: Effort normal and breath sounds normal. No respiratory distress. No wheezes.No rales.  Abdominal: Soft. Bowel sounds are normal. +distended. +firmness in RLQ with tenderness to palpation. Musculoskeletal: No edema or tenderness.  Lymphadenopathy:    No cervical or supraclavicular adenopathy.  Neurological: Alert and oriented to person, place, and time. No cranial nerve deficit.  Skin: Skin is warm and dry. No rash noted. No erythema. No pallor.  Psychiatric: Affect and judgment normal.    LABORATORY DATA: I have personally reviewed the data as listed:  Hospital Outpatient Visit on 04/19/2017  Component Date Value Ref Range Status  . Creatinine, Ser 04/19/2017 1.00  0.44 - 1.00 mg/dL Final    RADIOGRAPHIC STUDIES: I have personally reviewed the radiological images as listed and agree with the findings in the report  Ct Abdomen Pelvis W Contrast  Result Date: 04/19/2017 CLINICAL DATA:  Left-sided abdominal pain and nausea for 3 weeks. EXAM: CT ABDOMEN AND PELVIS WITH CONTRAST TECHNIQUE: Multidetector CT imaging of the abdomen and pelvis was performed using the standard protocol following bolus administration of intravenous contrast. CONTRAST:  128mL ISOVUE-300 IOPAMIDOL (ISOVUE-300) INJECTION 61% COMPARISON: 12/29/2011 FINDINGS: Lower chest: There are small bilateral pleural effusions identified right greater than left. There  is extensive areas of emphysema within both lower lobes as well as cylindrical and cystic bronchiectasis. Several several pleural based lesions are identified overlying the right lung. Index lesion anteriorly measures 4.2 x 2.8 cm. Medial pleural base mass measures 2.4 cm. Within the left cardiophrenic angle there are 2 soft tissue nodules which measure up to 2.1 cm, image 17 of series 2. Hepatobiliary: There it previous cholecystectomy. The common bile duct appears mildly increased in caliber measuring up to 9 mm. Pancreas: Unremarkable. No pancreatic ductal dilatation or surrounding inflammatory changes. Spleen: Calcified granulomas identified. Adrenals/Urinary Tract: Normal appearance of the adrenal glands. Right kidney cysts noted. Unremarkable appearance of the left kidney. Urinary bladder appears normal. Stomach/Bowel: Stomach appears normal. There is no pathologic dilatation of the large or small bowel loops. Vascular/Lymphatic: Aortic atherosclerosis. No aneurysm. No enlarged retroperitoneal lymph nodes. No pelvic or inguinal adenopathy. Reproductive: Previous hysterectomy. Other: There is a large solid mass within the central mesenteric which extends from the level of the portal venous confluence into the right iliac fossa.  This measures 7.5 by 7.7 by 16.8 cm. The portal venous confluence is partially encased. The superior mesenteric vein is completely encased but appears opacified. There is a moderate volume of ascites identified within the abdomen or pelvis. Extensive peritoneal carcinomatosis with omental caking is identified. Musculoskeletal: Anterolisthesis of L5 on S1 noted. There is degenerative disc disease noted at L4-5 and L5-S1. IMPRESSION: 1. Exam is positive for large abdominal mesenteric mass which has a maximum dimension of 16.8 cm and involves the portal venous confluence and superior mesenteric pain. 2. Ascites and diffuse peritoneal nodularity with omental caking compatible with  carcinomatosis. 3. Evidence of thoracic tumor involvement with bilateral pleural effusions and soft tissue nodularity overlying the right lung. There also enlarged left cardiophrenic lymph nodes. 4. There is dilatation of the intrahepatic bile ducts. Findings may reflect mass effect upon the distal common bile duct from the large mesenteric mass. Electronically Signed   By: Kerby Moors M.D.   On: 04/19/2017 14:28  CT ABDOMEN PELVIS W CONTRAST (Accession [id number]) (Order [id number])  Imaging  Date: 04/19/2017 Department: [facility] Released By: Alanda Slim Authorizing: Celene Squibb, MD  Exam Information   Status Exam Begun  Exam Ended   Final [99] 04/19/2017 1:31 PM 04/19/2017 2:03 PM  PACS Images   Show images for CT ABDOMEN PELVIS W CONTRAST  Study Result   CLINICAL DATA:  Left-sided abdominal pain and nausea for 3 weeks.  EXAM: CT ABDOMEN AND PELVIS WITH CONTRAST  TECHNIQUE: Multidetector CT imaging of the abdomen and pelvis was performed using the standard protocol following bolus administration of intravenous contrast.  CONTRAST:  128mL ISOVUE-300 IOPAMIDOL (ISOVUE-300) INJECTION 61%  COMPARISON: 12/29/2011  FINDINGS: Lower chest: There are small bilateral pleural effusions identified right greater than left. There is extensive areas of emphysema within both lower lobes as well as cylindrical and cystic bronchiectasis. Several several pleural based lesions are identified overlying the right lung. Index lesion anteriorly measures 4.2 x 2.8 cm. Medial pleural base mass measures 2.4 cm. Within the left cardiophrenic angle there are 2 soft tissue nodules which measure up to 2.1 cm, image 17 of series 2.  Hepatobiliary: There it previous cholecystectomy. The common bile duct appears mildly increased in caliber measuring up to 9 mm.  Pancreas: Unremarkable. No pancreatic ductal dilatation or surrounding inflammatory changes.  Spleen: Calcified  granulomas identified.  Adrenals/Urinary Tract: Normal appearance of the adrenal glands. Right kidney cysts noted. Unremarkable appearance of the left kidney. Urinary bladder appears normal.  Stomach/Bowel: Stomach appears normal. There is no pathologic dilatation of the large or small bowel loops.  Vascular/Lymphatic: Aortic atherosclerosis. No aneurysm. No enlarged retroperitoneal lymph nodes. No pelvic or inguinal adenopathy.  Reproductive: Previous hysterectomy.  Other: There is a large solid mass within the central mesenteric which extends from the level of the portal venous confluence into the right iliac fossa. This measures 7.5 by 7.7 by 16.8 cm. The portal venous confluence is partially encased. The superior mesenteric vein is completely encased but appears opacified. There is a moderate volume of ascites identified within the abdomen or pelvis. Extensive peritoneal carcinomatosis with omental caking is identified.  Musculoskeletal: Anterolisthesis of L5 on S1 noted. There is degenerative disc disease noted at L4-5 and L5-S1.  IMPRESSION: 1. Exam is positive for large abdominal mesenteric mass which has a maximum dimension of 16.8 cm and involves the portal venous confluence and superior mesenteric pain. 2. Ascites and diffuse peritoneal nodularity with omental caking compatible with carcinomatosis. 3.  Evidence of thoracic tumor involvement with bilateral pleural effusions and soft tissue nodularity overlying the right lung. There also enlarged left cardiophrenic lymph nodes. 4. There is dilatation of the intrahepatic bile ducts. Findings may reflect mass effect upon the distal common bile duct from the large mesenteric mass.     ASSESSMENT: 83 year old female with large 16.8 cm mesenteric mass with omental caking/peritoneal carcinomatosis, bilateral pleural effusions, and pleural nodules concerning for metastatic ovarian cancer.  PLAN: -Reviewed CT  abd/pelvis in detail with the patient and her family today. I have expressed my concern that she likely has a stage IV GYN malignancy. She will need definitive tissue diagnosis with biopsy. I will send patient to see a GYN-oncologist, Dr. Denman George, for her evaluation and treatment recommendations.  -I have ordered a CT chest with contrast to complete staging. -Ordered CA 125. -Recommended for her to start drinking Ensure/Boost to improve her nutritional status. -Advised her that she can take her zofran 4 mg 1-2 tabs PO q4hPRN nausea. -She does not need any pain meds at this time.  -RTC in 3 weeks for follow up.  Orders Placed This Encounter  Procedures  . CT Chest W Contrast    Standing Status:   Future    Standing Expiration Date:   04/21/2018    Order Specific Question:   If indicated for the ordered procedure, I authorize the administration of contrast media per Radiology protocol    Answer:   Yes    Order Specific Question:   Preferred imaging location?    Answer:   [facility]    Order Specific Question:   Radiology Contrast Protocol - do NOT remove file path    Answer:   [email or web address]    Order Specific Question:   Reason for Exam additional comments    Answer:   patient with pleural lesions, b/l pleural effusions, abdominal mass, staging scan for suspected GYN malignancy  . CA 125    Standing Status:   Future    Standing Expiration Date:   04/21/2018    All questions were answered. The patient knows to call the clinic with any problems, questions or concerns.  This note was electronically signed.    Twana First, MD  04/21/2017 2:37 PM

## 2017-04-22 ENCOUNTER — Telehealth: Payer: Self-pay

## 2017-04-22 LAB — CA 125: Cancer Antigen (CA) 125: 313.4 U/mL — ABNORMAL HIGH (ref 0.0–38.1)

## 2017-04-22 NOTE — Telephone Encounter (Signed)
Pt.'s family calling to find out where she needs to be brought as far as hospitals prior to her visit 04-30-17 with Dr. Denman George if her condition declines.  Patient is weak.   Told family member that she would need to be brought to [facility] as Dr. Talbert Cage is familiar with her case per  Joylene John, NP.  Family member verbalized understanding.

## 2017-04-26 ENCOUNTER — Emergency Department (HOSPITAL_COMMUNITY): Payer: Medicare Other

## 2017-04-26 ENCOUNTER — Emergency Department (HOSPITAL_COMMUNITY)
Admission: EM | Admit: 2017-04-26 | Discharge: 2017-04-26 | Disposition: A | Discharge status: Home / Self Care | Payer: Medicare Other | Attending: Emergency Medicine | Admitting: Emergency Medicine

## 2017-04-26 ENCOUNTER — Encounter (HOSPITAL_COMMUNITY): Payer: Self-pay | Admitting: Emergency Medicine

## 2017-04-26 DIAGNOSIS — I10 Essential (primary) hypertension: Secondary | ICD-10-CM | POA: Diagnosis not present

## 2017-04-26 DIAGNOSIS — R109 Unspecified abdominal pain: Secondary | ICD-10-CM | POA: Diagnosis not present

## 2017-04-26 DIAGNOSIS — R0602 Shortness of breath: Secondary | ICD-10-CM | POA: Diagnosis present

## 2017-04-26 DIAGNOSIS — R1013 Epigastric pain: Secondary | ICD-10-CM

## 2017-04-26 DIAGNOSIS — R112 Nausea with vomiting, unspecified: Secondary | ICD-10-CM | POA: Diagnosis not present

## 2017-04-26 DIAGNOSIS — Z79899 Other long term (current) drug therapy: Secondary | ICD-10-CM | POA: Diagnosis not present

## 2017-04-26 LAB — CBC WITH DIFFERENTIAL/PLATELET
BASOS PCT: 1 %
Basophils Absolute: 0.1 10*3/uL (ref 0.0–0.1)
EOS PCT: 2 %
Eosinophils Absolute: 0.1 10*3/uL (ref 0.0–0.7)
HEMATOCRIT: 34.3 % — AB (ref 36.0–46.0)
Hemoglobin: 11.6 g/dL — ABNORMAL LOW (ref 12.0–15.0)
Lymphocytes Relative: 14 %
Lymphs Abs: 1.1 10*3/uL (ref 0.7–4.0)
MCH: 30.4 pg (ref 26.0–34.0)
MCHC: 33.8 g/dL (ref 30.0–36.0)
MCV: 90 fL (ref 78.0–100.0)
MONO ABS: 0.7 10*3/uL (ref 0.1–1.0)
MONOS PCT: 9 %
NEUTROS ABS: 5.7 10*3/uL (ref 1.7–7.7)
Neutrophils Relative %: 74 %
Platelets: 364 10*3/uL (ref 150–400)
RBC: 3.81 MIL/uL — ABNORMAL LOW (ref 3.87–5.11)
RDW: 13.2 % (ref 11.5–15.5)
WBC: 7.7 10*3/uL (ref 4.0–10.5)

## 2017-04-26 LAB — COMPREHENSIVE METABOLIC PANEL
ALT: 14 U/L (ref 14–54)
ANION GAP: 10 (ref 5–15)
AST: 28 U/L (ref 15–41)
Albumin: 3.5 g/dL (ref 3.5–5.0)
Alkaline Phosphatase: 66 U/L (ref 38–126)
BILIRUBIN TOTAL: 1.1 mg/dL (ref 0.3–1.2)
BUN: 27 mg/dL — ABNORMAL HIGH (ref 6–20)
CO2: 20 mmol/L — ABNORMAL LOW (ref 22–32)
Calcium: 9.5 mg/dL (ref 8.9–10.3)
Chloride: 101 mmol/L (ref 101–111)
Creatinine, Ser: 2.06 mg/dL — ABNORMAL HIGH (ref 0.44–1.00)
GFR, EST AFRICAN AMERICAN: 24 mL/min — AB (ref >60)
GFR, EST NON AFRICAN AMERICAN: 20 mL/min — AB (ref >60)
Glucose, Bld: 92 mg/dL (ref 65–99)
POTASSIUM: 5.5 mmol/L — AB (ref 3.5–5.1)
Sodium: 131 mmol/L — ABNORMAL LOW (ref 135–145)
TOTAL PROTEIN: 6.2 g/dL — AB (ref 6.5–8.1)

## 2017-04-26 LAB — URINALYSIS, ROUTINE W REFLEX MICROSCOPIC
Bilirubin Urine: NEGATIVE
Glucose, UA: NEGATIVE mg/dL
Hgb urine dipstick: NEGATIVE
KETONES UR: NEGATIVE mg/dL
Leukocytes, UA: NEGATIVE
Nitrite: NEGATIVE
PROTEIN: NEGATIVE mg/dL
Specific Gravity, Urine: 1.012 (ref 1.005–1.030)
pH: 5 (ref 5.0–8.0)

## 2017-04-26 MED ORDER — ONDANSETRON HCL 4 MG/2ML IJ SOLN
4.0000 mg | Freq: Once | INTRAMUSCULAR | Status: AC
  Administered 2017-04-26: 4 mg via INTRAVENOUS
  Filled 2017-04-26: qty 2

## 2017-04-26 MED ORDER — TRAMADOL HCL 50 MG PO TABS: 50.0000 mg | ORAL_TABLET | Freq: Four times a day (QID) | ORAL | 0 refills | Status: AC | PRN

## 2017-04-26 MED ORDER — ALBUTEROL SULFATE (2.5 MG/3ML) 0.083% IN NEBU: 5.0000 mg | INHALATION_SOLUTION | Freq: Once | RESPIRATORY_TRACT | Status: DC

## 2017-04-26 MED ORDER — ONDANSETRON 4 MG PO TBDP: ORAL_TABLET | ORAL | 0 refills | Status: DC

## 2017-04-26 MED ORDER — SODIUM CHLORIDE 0.9 % IV BOLUS (SEPSIS)
1000.0000 mL | Freq: Once | INTRAVENOUS | Status: AC
Start: 2017-04-26 — End: 2017-04-26
  Administered 2017-04-26: 1000 mL via INTRAVENOUS

## 2017-04-26 MED ORDER — ONDANSETRON 4 MG PO TBDP
4.0000 mg | ORAL_TABLET | Freq: Once | ORAL | Status: AC
  Administered 2017-04-26: 4 mg via ORAL
  Filled 2017-04-26: qty 1

## 2017-04-26 NOTE — ED Notes (Signed)
Family at bedside. 

## 2017-04-26 NOTE — ED Notes (Signed)
Pt's family member out to desk, advises that pt is requesting something for pain, Dr Roderic Palau notified,

## 2017-04-26 NOTE — ED Notes (Signed)
ED Provider at bedside. 

## 2017-04-26 NOTE — ED Notes (Signed)
At discharge RN offered pt something for pain, pt refused states "if I can get home my pain will be better"

## 2017-04-26 NOTE — ED Triage Notes (Signed)
Patient complains of shortness of breath and abdominal pain with nausea and vomiting. Patient states recent diagnosis of abdominal cancer.

## 2017-04-26 NOTE — Discharge Instructions (Signed)
Follow-up with your family doctor this week to get your potassium checked and to determine the treatment for your abdominal mass

## 2017-04-28 NOTE — ED Provider Notes (Signed)
[facility] DEPT Provider Note   CSN: [id number] Arrival date & time: 04/26/17  1720     History   Chief Complaint Chief Complaint  Patient presents with  . Shortness of Breath    HPI Shelia Vaughn is a 83 y.o. female.  Patient complains of abdominal pain and nausea vomiting. She has a large abdominal mass that is suspected to be GYN cancer.   The history is provided by the patient. No language interpreter was used.  Abdominal Pain   This is a recurrent problem. The current episode started more than 1 week ago. The problem has not changed since onset.The pain is associated with an unknown factor. The pain is located in the epigastric region. The quality of the pain is aching. The pain is at a severity of 6/10. The pain is moderate. Associated symptoms include vomiting. Pertinent negatives include anorexia, diarrhea, frequency, hematuria and headaches.    Past Medical History:  Diagnosis Date  . Hypertension   . 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 Abdominal mass 04/21/2017  . 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 ABDOMINAL HYSTERECTOMY    . CHOLECYSTECTOMY      OB History    No data available       Home Medications    Prior to Admission medications   Medication Sig Start Date End Date Taking? Authorizing Provider  amLODipine (NORVASC) 5 MG tablet Take 5 mg by mouth daily.  06/22/16  Yes [provider]  BIOTIN PO Take 1 tablet by mouth daily.   Yes [provider]  calcium-vitamin D (OSCAL WITH D) 500-200 MG-UNIT tablet Take 1 tablet by mouth daily with breakfast.   Yes [provider]  Cholecalciferol (VITAMIN D) 2000 units CAPS Take 1 capsule by mouth daily.   Yes [provider]  GLUCOSAMINE-CHONDROITIN PO Take 1 tablet by mouth daily.   Yes [provider]  naproxen sodium (ALEVE) 220  MG tablet Take 220 mg by mouth 2 (two) times daily as needed (for pain).   Yes [provider]  nebivolol (BYSTOLIC) 5 MG tablet Take 5 mg by mouth daily.  06/22/16  Yes [provider]  Omega-3 Fatty Acids (FISH OIL PO) Take 1 tablet by mouth daily.   Yes [provider]  ondansetron (ZOFRAN) 4 MG tablet Take 4 mg by mouth every 8 (eight) hours as needed for nausea or vomiting.   Yes [provider]  Probiotic Product (PROBIOTIC PO) Take 1 tablet by mouth daily.   Yes [provider]  risedronate (ACTONEL) 150 MG tablet Take 150 mg by mouth every 30 (thirty) days.  06/22/16  Yes [provider]  telmisartan (MICARDIS) 80 MG tablet Take 80 mg by mouth daily.  06/22/16  Yes [provider]  Travoprost, BAK Free, (TRAVATAN Z) 0.004 % SOLN ophthalmic solution INSTILL ONE DROP INTO BOTH EYES AT BEDTIME AS DIRECTED 03/15/17  Yes [provider]  VITAMIN E PO Take 1 tablet by mouth daily.   Yes [provider]  ondansetron (ZOFRAN ODT) 4 MG disintegrating tablet 4mg  ODT q4 hours prn nausea/vomit 04/26/17   Milton Ferguson, MD  traMADol (ULTRAM) 50 MG tablet Take 1 tablet (50 mg total) by mouth every 6 (six) hours as needed. 04/26/17   Milton Ferguson, MD    Family History No family history on file.  Social History Social  History  Substance Use Topics  . Smoking status: Never Smoker  . Smokeless tobacco: Never Used  . Alcohol use No     Comment: drank in her earlier years socially     Allergies   Latex   Review of Systems Review of Systems  Constitutional: Negative for appetite change and fatigue.  HENT: Negative for congestion, ear discharge and sinus pressure.   Eyes: Negative for discharge.  Respiratory: Negative for cough.   Cardiovascular: Negative for chest pain.  Gastrointestinal: Positive for abdominal pain and vomiting. Negative for anorexia and diarrhea.  Genitourinary: Negative for frequency and  hematuria.  Musculoskeletal: Negative for back pain.  Skin: Negative for rash.  Neurological: Negative for seizures and headaches.  Psychiatric/Behavioral: Negative for hallucinations.     Physical Exam Updated Vital Signs BP (!) 117/96 (BP Location: Left Arm)   Pulse 74   Temp 97.7 F (36.5 C) (Oral)   Resp (!) 28   Ht 5' (1.524 m)   Wt 64.4 kg (142 lb)   SpO2 99%   BMI 27.73 kg/m   Physical Exam  Constitutional: She is oriented to person, place, and time. She appears well-developed.  HENT:  Head: Normocephalic.  Eyes: Conjunctivae and EOM are normal. No scleral icterus.  Neck: Neck supple. No thyromegaly present.  Cardiovascular: Normal rate and regular rhythm.  Exam reveals no gallop and no friction rub.   No murmur heard. Pulmonary/Chest: No stridor. She has no wheezes. She has no rales. She exhibits no tenderness.  Abdominal: She exhibits no distension. There is tenderness. There is no rebound.  Musculoskeletal: Normal range of motion. She exhibits no edema.  Lymphadenopathy:    She has no cervical adenopathy.  Neurological: She is oriented to person, place, and time. She exhibits normal muscle tone. Coordination normal.  Skin: No rash noted. No erythema.  Psychiatric: She has a normal mood and affect. Her behavior is normal.     ED Treatments / Results  Labs (all labs ordered are listed, but only abnormal results are displayed) Labs Reviewed  CBC WITH DIFFERENTIAL/PLATELET - Abnormal; Notable for the following:       Result Value   RBC 3.81 (*)    Hemoglobin 11.6 (*)    HCT 34.3 (*)    All other components within normal limits  COMPREHENSIVE METABOLIC PANEL - Abnormal; Notable for the following:    Sodium 131 (*)    Potassium 5.5 (*)    CO2 20 (*)    BUN 27 (*)    Creatinine, Ser 2.06 (*)    Total Protein 6.2 (*)    GFR calc non Af Amer 20 (*)    GFR calc Af Amer 24 (*)    All other components within normal limits  URINALYSIS, ROUTINE W REFLEX  MICROSCOPIC - Abnormal; Notable for the following:    APPearance HAZY (*)    All other components within normal limits    EKG  EKG Interpretation None       Radiology Dg Abd Acute W/chest  Result Date: 04/26/2017 CLINICAL DATA:  Patient complains of shortness of breath and abdominal pain with nausea and vomiting. Patient states recent diagnosis of abdominal cancer. EXAM: DG ABDOMEN ACUTE W/ 1V CHEST COMPARISON:  Chest x-ray dated 11/11/2011. FINDINGS: Single-view of the chest: Study is hypoinspiratory with crowding of the perihilar and bibasilar bronchovascular markings. Suspect superimposed atelectasis and/or small pleural effusions at each lung base. Upper lungs are relatively clear. Cardiomegaly appears stable. Atherosclerotic changes noted at the  aortic arch. Osseous structures about the chest are unremarkable. Supine and upright views of the abdomen: Visualized bowel gas pattern is nonobstructive. No evidence of soft tissue mass or abnormal fluid collection. No evidence of free intraperitoneal air. No acute or suspicious osseous finding. IMPRESSION: 1. Low lung volumes. Probable atelectasis and/or small pleural effusions at each lung base. 2. Nonobstructive bowel gas pattern and no evidence of acute intra-abdominal abnormality. 3. Aortic atherosclerosis. Electronically Signed   By: Franki Cabot M.D.   On: 04/26/2017 18:59    Procedures Procedures (including critical care time)  Medications Ordered in ED Medications  sodium chloride 0.9 % bolus 1,000 mL (0 mLs Intravenous Stopped 04/26/17 2021)  ondansetron (ZOFRAN) injection 4 mg (4 mg Intravenous Given 04/26/17 1842)  ondansetron (ZOFRAN-ODT) disintegrating tablet 4 mg (4 mg Oral Given 04/26/17 2329)     Initial Impression / Assessment and Plan / ED Course  I have reviewed the triage vital signs and the nursing notes.  Pertinent labs & imaging results that were available during my care of the patient were reviewed by me and  considered in my medical decision making (see chart for details).     Patient symptoms improved some with nausea medicine. Patient wanted to go home. I told her that her potassium was slightly elevated and she should have that rechecked in the next day or 2  by her family doctor. She notes that she has a large abdominal mass causing this problem and is being seen by oncology  Final Clinical Impressions(s) / ED Diagnoses   Final diagnoses:  Epigastric pain    New Prescriptions Discharge Medication List as of 04/26/2017 11:15 PM    START taking these medications   Details  ondansetron (ZOFRAN ODT) 4 MG disintegrating tablet 4mg  ODT q4 hours prn nausea/vomit, Print    traMADol (ULTRAM) 50 MG tablet Take 1 tablet (50 mg total) by mouth every 6 (six) hours as needed., Starting Mon 04/26/2017, Print         Milton Ferguson, MD 04/28/17 [phone]

## 2017-04-29 ENCOUNTER — Encounter: Payer: Self-pay | Admitting: Gynecologic Oncology

## 2017-04-29 ENCOUNTER — Ambulatory Visit: Payer: Medicare Other | Attending: Gynecologic Oncology | Admitting: Gynecologic Oncology

## 2017-04-29 ENCOUNTER — Ambulatory Visit (HOSPITAL_COMMUNITY)
Admission: RE | Admit: 2017-04-29 | Discharge: 2017-04-29 | Disposition: A | Discharge status: Home / Self Care | Payer: Medicare Other | Source: Ambulatory Visit | Attending: Gynecologic Oncology | Admitting: Gynecologic Oncology

## 2017-04-29 VITALS — BP 111/58 | HR 70 | Temp 97.9°F | Resp 20 | Wt 144.8 lb

## 2017-04-29 DIAGNOSIS — R1900 Intra-abdominal and pelvic swelling, mass and lump, unspecified site: Secondary | ICD-10-CM

## 2017-04-29 DIAGNOSIS — C786 Secondary malignant neoplasm of retroperitoneum and peritoneum: Secondary | ICD-10-CM | POA: Insufficient documentation

## 2017-04-29 DIAGNOSIS — C7800 Secondary malignant neoplasm of unspecified lung: Secondary | ICD-10-CM | POA: Diagnosis not present

## 2017-04-29 DIAGNOSIS — Z801 Family history of malignant neoplasm of trachea, bronchus and lung: Secondary | ICD-10-CM | POA: Diagnosis not present

## 2017-04-29 DIAGNOSIS — J90 Pleural effusion, not elsewhere classified: Secondary | ICD-10-CM | POA: Diagnosis not present

## 2017-04-29 DIAGNOSIS — C801 Malignant (primary) neoplasm, unspecified: Secondary | ICD-10-CM | POA: Diagnosis not present

## 2017-04-29 DIAGNOSIS — Z803 Family history of malignant neoplasm of breast: Secondary | ICD-10-CM | POA: Insufficient documentation

## 2017-04-29 DIAGNOSIS — R188 Other ascites: Secondary | ICD-10-CM | POA: Insufficient documentation

## 2017-04-29 DIAGNOSIS — R971 Elevated cancer antigen 125 [CA 125]: Secondary | ICD-10-CM | POA: Diagnosis not present

## 2017-04-29 DIAGNOSIS — I10 Essential (primary) hypertension: Secondary | ICD-10-CM | POA: Diagnosis not present

## 2017-04-29 DIAGNOSIS — Z90710 Acquired absence of both cervix and uterus: Secondary | ICD-10-CM | POA: Diagnosis not present

## 2017-04-29 MED ORDER — LIDOCAINE HCL 2 % IJ SOLN
INTRAMUSCULAR | Status: AC
  Filled 2017-04-29: qty 10

## 2017-04-29 NOTE — Procedures (Signed)
Ultrasound-guided diagnostic and therapeutic paracentesis performed yielding 2.5 liters of hazy, blood-tinged fluid. No immediate complications. A portion of the fluid was submitted to the lab for cytology.

## 2017-04-29 NOTE — Progress Notes (Signed)
Consult Note: Gyn-Onc  Consult was requested by Dr. Talbert Cage  for the evaluation of Sue Lush 83 y.o. female  CC: Abdominal mass, ascites  Assessment/Plan:  Ms. MEILIN BROSH is a 83 y.o.  with prior history of hysterectomy and bilateral salpingo-oophorectomy will presents with a history of 1 month increase in abdominal girth fatigue poor appetite and imaging notable for a 17 cm primarily abdominal mass with ascites pleural effusion omental caking and carcinomatosis. Is evidence of pulmonary metastatic disease. CA-125 is elevated in the 300s. The mesenteric mass involves the portal venous system and the superior mesenteric artery.  If this is a GYN malignancy the recommendation would be for neoadjuvant chemotherapy followed by interval debulking if the disease is responsive. I have ordered a paracentesis for symptom relief and also to make the diagnosis. A discussion was held with Ms. Umble regarding her goals of care.  The options presented to her if this is confirmed as a GYN malignancy R neoadjuvant chemotherapy followed by interval debulking an additional chemotherapy versus chemotherapy alone or supportive care.  Ms. Birchmeier declines any surgical intervention and is aware that her prognosis is poor.  However her goal is for a better quality of life than present and her current state causes much distress because she was so physically active and independent up until a month ago.  Ms. Remsen has already been seen by Dr.Zhou.  I had a conversation with Dr.Zhou and shared the outcome of the goals of care discussion. The plan is to begin with single agent carboplatin with the addition of a taxane and bevacizumab based on her ability to tolerate chemotherapy. I arranged for a diagnostic paracentesis today in follow-up with medical oncology in September 25.  Ms. Brick was counseled to increase oral intake and to enter effort to have multiple very small meals during the day. All of her questions and those  of her family were answered to her satisfaction. Avastin that she follow up to GYN oncology in 2-3 months.   HPI: Ms. DELAINEY WINSTANLEY is a 49 y.o.  who presents today accompanied by her friend and niece. She is a gravida 1 para 1 who was in her usual state of excellent health until 1 month ago. 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  Her history is notable for hysterectomy with bilateral salpingo-oophorectomy the age of 83. After that she was on estrogen replacement treatment.  Over the course of the past months she's noted increase in abdominal girth decreased appetite early satiety and pelvic pain. She presented to her primary care physician Dr. [place] Crane ordered a CT of the abdomen and pelvis. That study completed on 04/19/2017 demonstrated a 16.8 cm abdominal mesenteric mass balding the portal venous confluence and superior mesenteric vein. There was evidence of ascites diffuse pelvic nodularity omental caking carcinomatosis and bilateral pleural effusions. There was concern that there was a soft tissue nodularity over the right lung as well as enlarged left cardiophrenic notes. CT of the chest is notable for round masslike area anteriorly in the right lower hemithorax/right middle lobe measuring up to 4.1 cm. CA-125 was obtained returned a value of 341.  Her family history is notable for a son who died of pancreatic cancer at the age of 70 a niece with a synchronous breast and endometrial cancers diagnosed at the age of 3 and the same niece was also diagnosed with endometrial cancer.    Review of  Systems:  Constitutional  Feels poorly.  Cardiovascular  No chest pain, intermittent shortness of breath, reports being able to sleep without any pillows Pulmonary  No cough or wheeze.  Gastro Intestinal  Assistant nausea, no vomitting, or diarrhoea. No bright red blood per rectum, central abdominal pain, increasing abdominal girth, early  satiety poor appetite change in bowel movement, or constipation.  Genito Urinary  No frequency, urgency, dysuria Musculo Skeletal  No myalgia, arthralgia, joint swelling or pain  Neurologic  No weakness, numbness, change in gait,  Psychology  No depression, anxiety, insomnia.    Current Meds:  Outpatient Encounter Prescriptions as of 04/29/2017  Medication Sig  . amLODipine (NORVASC) 5 MG tablet Take 5 mg by mouth daily.   Marland Kitchen BIOTIN PO Take 1 tablet by mouth daily.  . calcium-vitamin D (OSCAL WITH D) 500-200 MG-UNIT tablet Take 1 tablet by mouth daily with breakfast.  . Cholecalciferol (VITAMIN D) 2000 units CAPS Take 1 capsule by mouth daily.  Marland Kitchen GLUCOSAMINE-CHONDROITIN PO Take 1 tablet by mouth daily.  . naproxen sodium (ALEVE) 220 MG tablet Take 220 mg by mouth 2 (two) times daily as needed (for pain).  . nebivolol (BYSTOLIC) 5 MG tablet Take 5 mg by mouth daily.   . Omega-3 Fatty Acids (FISH OIL PO) Take 1 tablet by mouth daily.  . ondansetron (ZOFRAN ODT) 4 MG disintegrating tablet 4mg  ODT q4 hours prn nausea/vomit  . ondansetron (ZOFRAN) 4 MG tablet Take 4 mg by mouth every 8 (eight) hours as needed for nausea or vomiting.  . Probiotic Product (PROBIOTIC PO) Take 1 tablet by mouth daily.  . risedronate (ACTONEL) 150 MG tablet Take 150 mg by mouth every 30 (thirty) days.   Marland Kitchen telmisartan (MICARDIS) 80 MG tablet Take 80 mg by mouth daily.   . traMADol (ULTRAM) 50 MG tablet Take 1 tablet (50 mg total) by mouth every 6 (six) hours as needed.  . Travoprost, BAK Free, (TRAVATAN Z) 0.004 % SOLN ophthalmic solution INSTILL ONE DROP INTO BOTH EYES AT BEDTIME AS DIRECTED  . VITAMIN E PO Take 1 tablet by mouth daily.   No facility-administered encounter medications on file as of 04/29/2017.     Allergy:  Allergies  Allergen Reactions  . Latex Hives    Social Hx:   Social History   Social History  . Marital status: Married    Spouse name: N/A  . Number of children: N/A  . Years of  education: N/A   Occupational History  . Not on file.   Social History Main Topics  . Smoking status: Never Smoker  . Smokeless tobacco: Never Used  . Alcohol use No     Comment: drank in her earlier years socially  . Drug use: No  . Sexual activity: No   Other Topics Concern  . Not on file   Social History Narrative  . No narrative on file  Lives independently. 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 Until the recent change in status was able to jump Ro and walk 2 miles day and was incredibly active with an excellent quality of life  Past Surgical Hx:  Past Surgical History:  Procedure Laterality Date  . ABDOMINAL HYSTERECTOMY    . CHOLECYSTECTOMY      Past Medical Hx:  Past Medical History:  Diagnosis Date  . Hypertension   . Peritoneal carcinomatosis ([facility]) 04/21/2017    Past Gynecological History :Gravida 1 para 0 underwent hysterectomy bilateral salpingo-oophorectomy the age of 77 for  unclear reasons received estrogen therapy after surgery  Family Hx:  Father lung cancer smoker Niece synchronous endometrial and breast cancers at the age of 5 Nephew throat cancer with a history of tobacco and alcohol use Sister breast cancer diagnosed in the 89s  Brother lung cancer with a history of heavy smoking. Son pancreatic cancer diagnosed the age of 48  Vitals:  Blood pressure (!) 111/58, pulse 70, temperature 97.9 F (36.6 C), temperature source Oral, resp. rate 20, weight 144 lb 12.8 oz (65.7 kg).  Physical Exam: WD appears uncomfortable hearing aids in place Lymph Node Survey No cervical supraclavicular or inguinal adenopathy Cardiovascular  Pulse normal rate, regularity and rhythm.  Lungs  Clear to auscultation bilaterally, decreased breath sounds right lower lobe one third lower base.  Skin  No rash/lesions/breakdown  Psychiatry  Alert and oriented appropriate mood affect speech and reasoning. Abdomen  Hypo- bowel sounds, abdomen  markedly distended tense, no tenderness on palpation  Back No CVA tenderness Genito Urinary  Vulva/vagina: Normal external female genitalia.  No lesions. No discharge or bleeding.  Bladder/urethra:  No lesions or masses  Vagina: Discharge or bleeding, no cul-de-sac nodularity Cervix: Absent  Uterus: Absent  Rectal  Good tone, no masses no cul de sac nodularity.  Extremities  No bilateral cyanosis, clubbing, 1-2+ edema.   Janie Morning, MD, PhD 04/29/2017, 11:26 AM

## 2017-04-29 NOTE — Patient Instructions (Signed)
Plan to have a paracentesis today.  We will send that fluid for cytology.  You will be seeing Dr. Talbert Cage on Tuesday.  Please call for any questions or concerns.  We will plan on seeing in three months or sooner.

## 2017-04-30 ENCOUNTER — Ambulatory Visit: Payer: Medicare Other | Admitting: Gynecologic Oncology

## 2017-05-03 ENCOUNTER — Encounter (HOSPITAL_COMMUNITY): Payer: Self-pay | Admitting: Lab

## 2017-05-03 ENCOUNTER — Encounter (HOSPITAL_COMMUNITY): Payer: Medicare Other

## 2017-05-03 MED ORDER — ONDANSETRON 4 MG PO TBDP: ORAL_TABLET | ORAL | 0 refills | Status: AC

## 2017-05-03 NOTE — Progress Notes (Unsigned)
Referral sent to [facility].  Records faxed on 9/24

## 2017-05-10 DEATH — deceased

## 2017-05-14 ENCOUNTER — Ambulatory Visit (HOSPITAL_COMMUNITY): Payer: Medicare Other

## 2017-11-17 IMAGING — CT CT CHEST W/O CM
2 of 3 series · 15 of 36 positions shown, 18 images · non-contrast
Comparison: None.

CLINICAL DATA: Recent diagnosis of abdominal cancer, suspect she
right and malignancy. Bilateral pleural effusions.

EXAM:
CT CHEST WITHOUT CONTRAST
TECHNIQUE: Multidetector CT imaging of the chest was performed following the
standard protocol without IV contrast.

[Series 2: thorax · axial · 0.63mm/px · z∈[+1287,+1517]mm · 12 of 136 slices shown, 15 images]
[im 11/136  mediastinal]
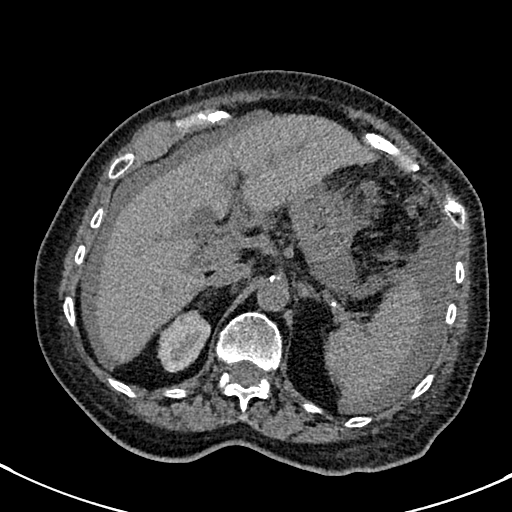
[im 11/136  lung]
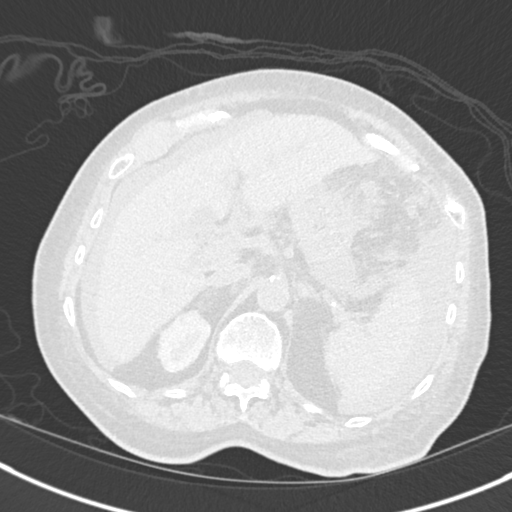
[im 21/136  lung]
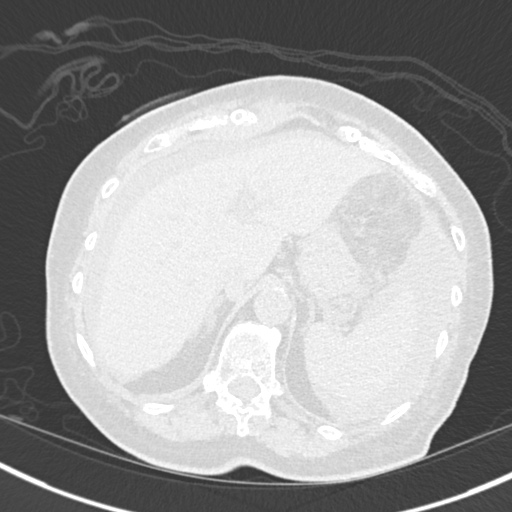
[im 31/136  lung]
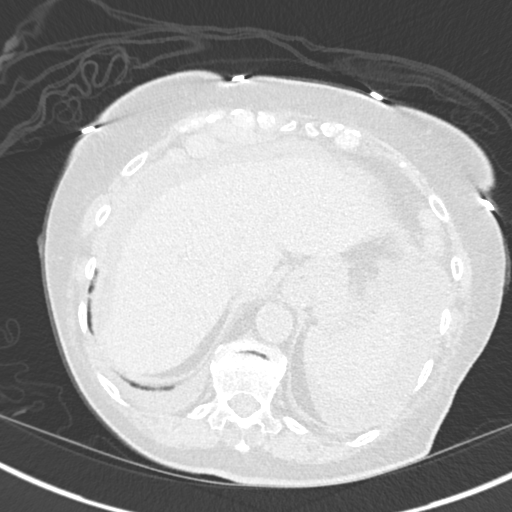
[im 41/136  lung]
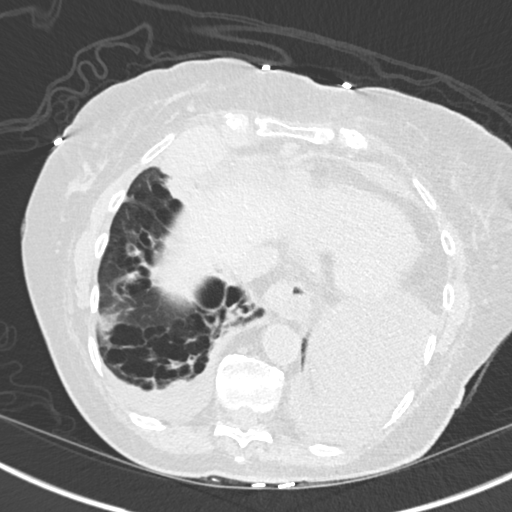
[im 51/136  mediastinal]
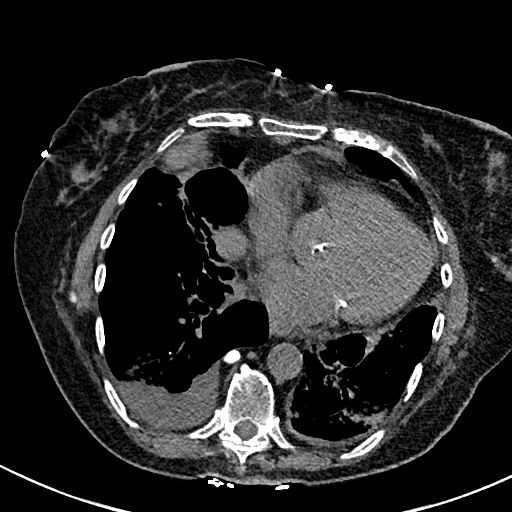
[im 51/136  lung]
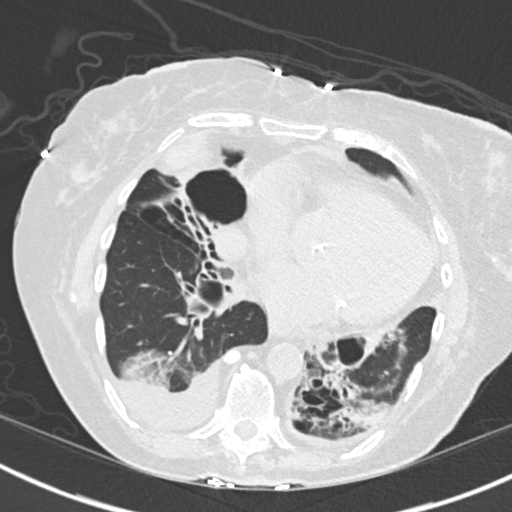
[im 61/136  lung]
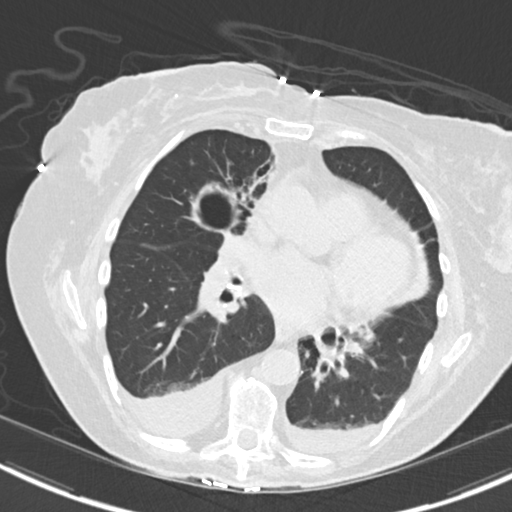
[im 76/136  lung]
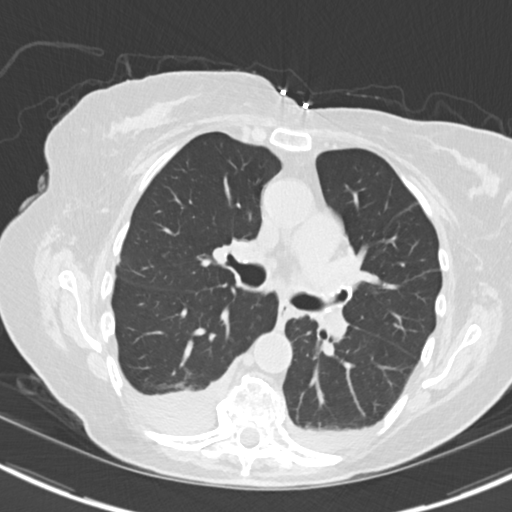
[im 86/136  lung]
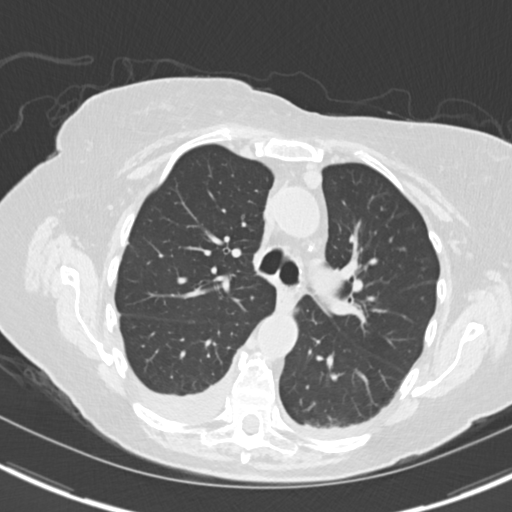
[im 96/136  mediastinal]
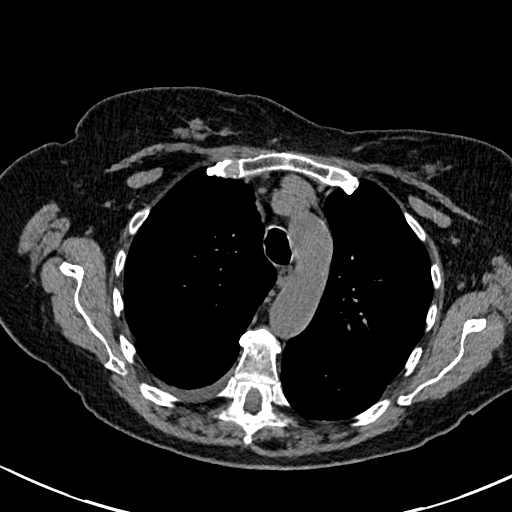
[im 96/136  lung]
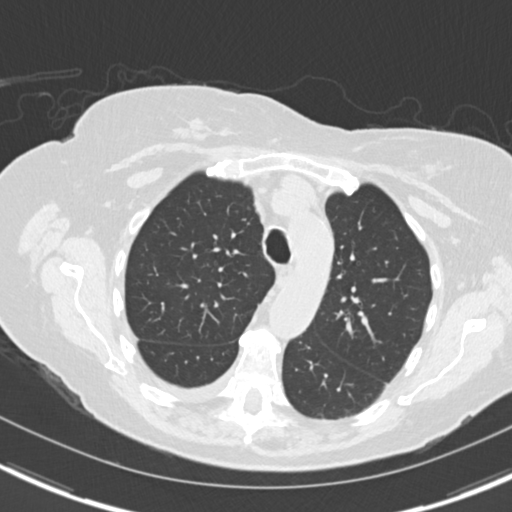
[im 106/136  lung]
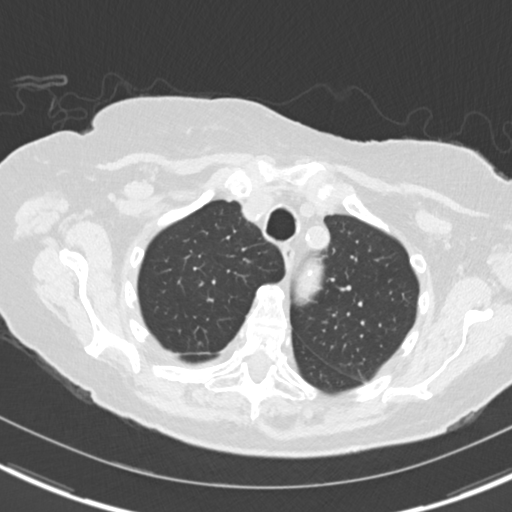
[im 116/136  lung]
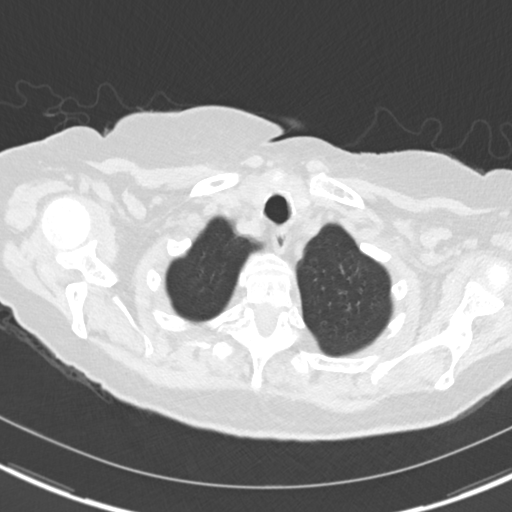
[im 126/136  lung]
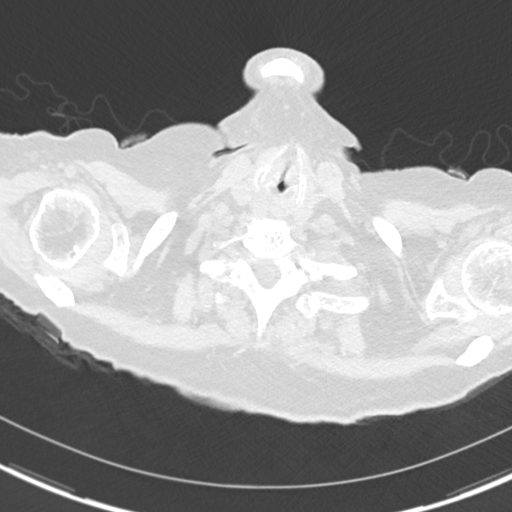

[Series 5: coronal · coronal · 0.59mm/px · 3 of 135 slices shown]
[im 27/135  lung]
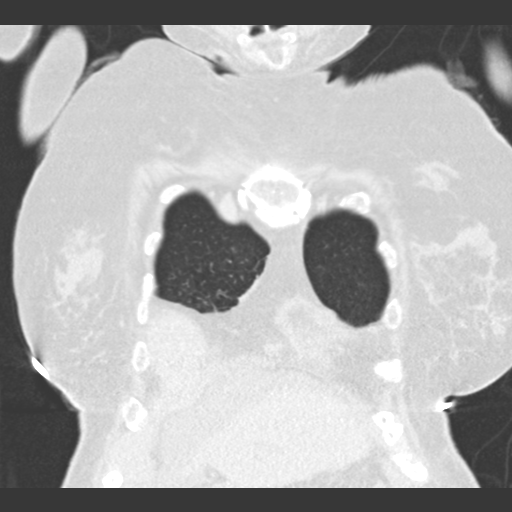
[im 54/135  lung]
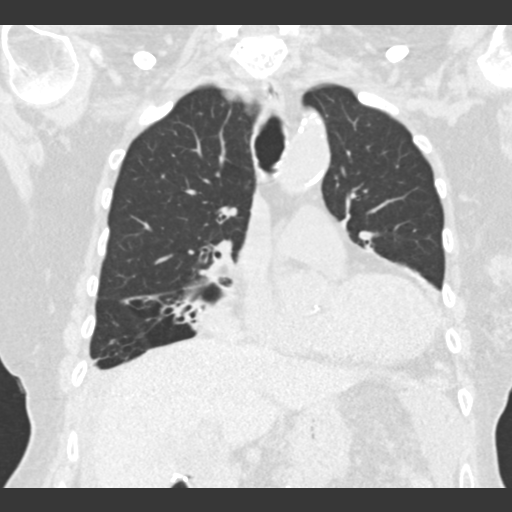
[im 81/135  lung]
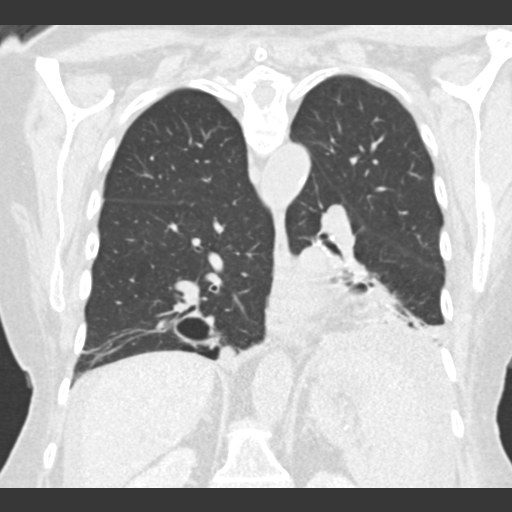

[15 of 36 positions shown; findings below may reference images not displayed]

FINDINGS: Cardiovascular: Heart is normal size. Aorta is normal caliber.
Scattered aortic calcifications. Mitral valve and aortic valve
calcifications.

Mediastinum/Nodes: Mildly prominent anterior mediastinal lymph node,
10 mm in short axis diameter on image 51. Other smaller scattered
mediastinal lymph nodes. No hilar or axillary adenopathy. Small
hiatal hernia.

Lungs/Pleura: Small bilateral pleural effusions. Extensive
bronchiectasis in the lower lobes, right middle lobe and lingula at
the lung bases with associated scarring. Rounded masslike area
anteriorly in the right lower hemithorax/right middle lobe measuring
up to 4.1 cm on image 95. This does not appear to be within the
middle lobe but likely pleural or subpleural.

Upper Abdomen: Ascites noted adjacent to the liver and spleen.

Musculoskeletal: Chest wall soft tissues are unremarkable. Severe
compression fracture in the T8 vertebral body with associated
kyphosis, stable since 0258 chest x-ray.
IMPRESSION: Small bilateral pleural effusions. Severe bronchiectasis and
scarring in the lung bases bilaterally.

Rounded soft tissue pleural or extrapleural mass anterior to the
right middle lobe at the right lung base measures 4.1 cm. Cannot
exclude metastatic focus.

Borderline anterior mediastinal lymph node.

Ascites noted in the upper abdomen.

Stable T8 compression fracture.

## 2017-11-22 IMAGING — CR DG ABDOMEN ACUTE W/ 1V CHEST
2 series · 3 of 3 positions shown · non-contrast
Comparison: Chest x-ray dated 11/11/2011.

CLINICAL DATA: Patient complains of shortness of breath and
abdominal pain with nausea and vomiting. Patient states recent
diagnosis of abdominal cancer.

EXAM:
DG ABDOMEN ACUTE W/ 1V CHEST

[ap portable (1 of 2)]
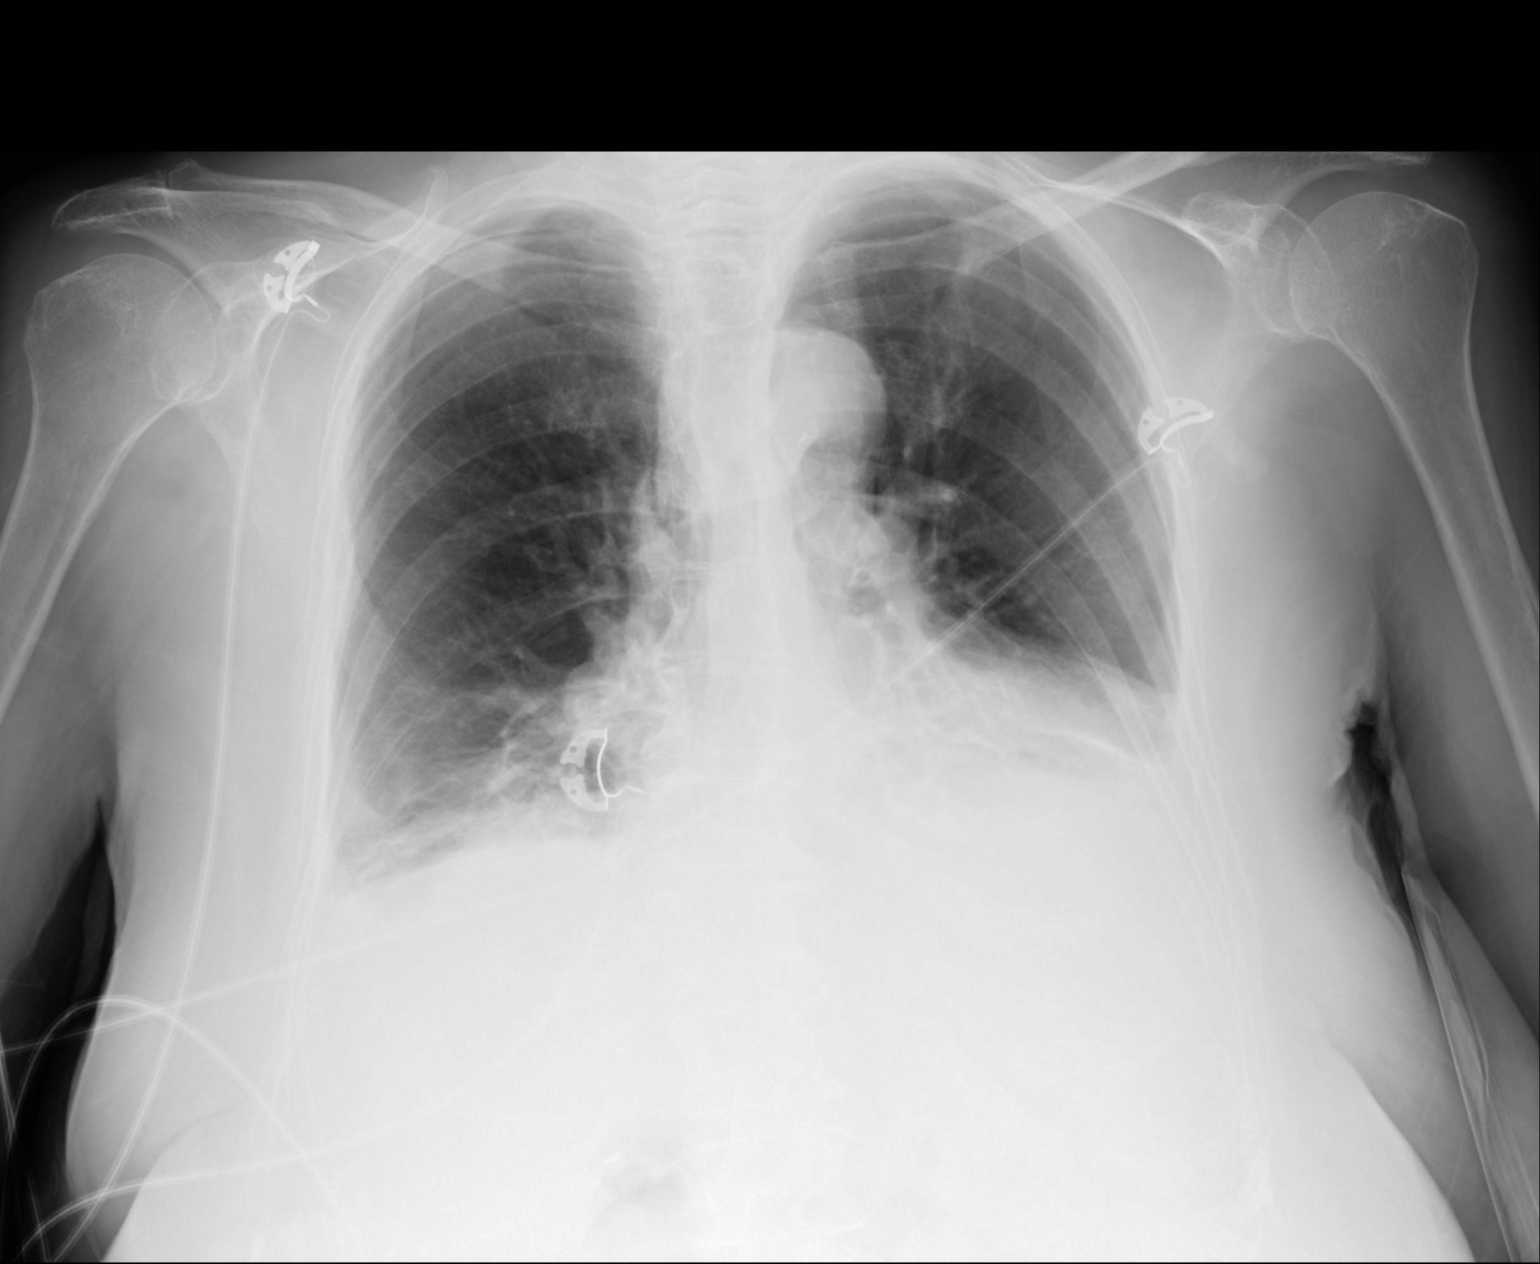

[Series 2: ap portable · 0.17mm/px · 2 of 2 slices shown (2 of 2)]
[im 1/2]
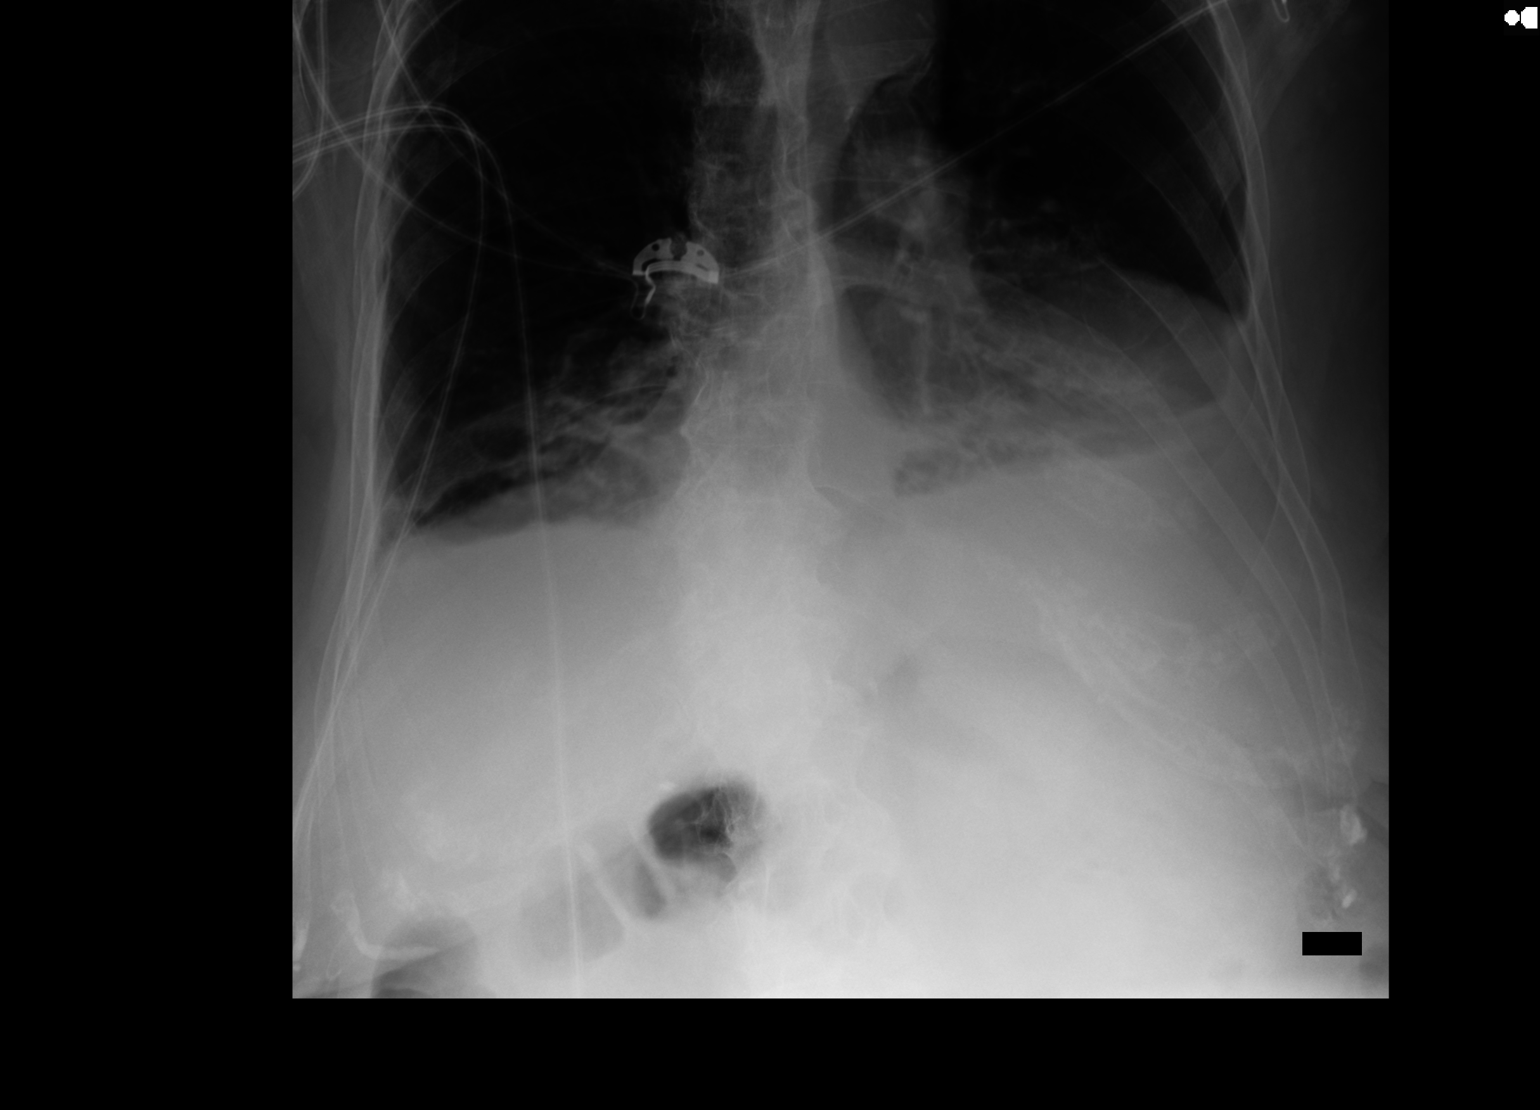
[im 2/2]
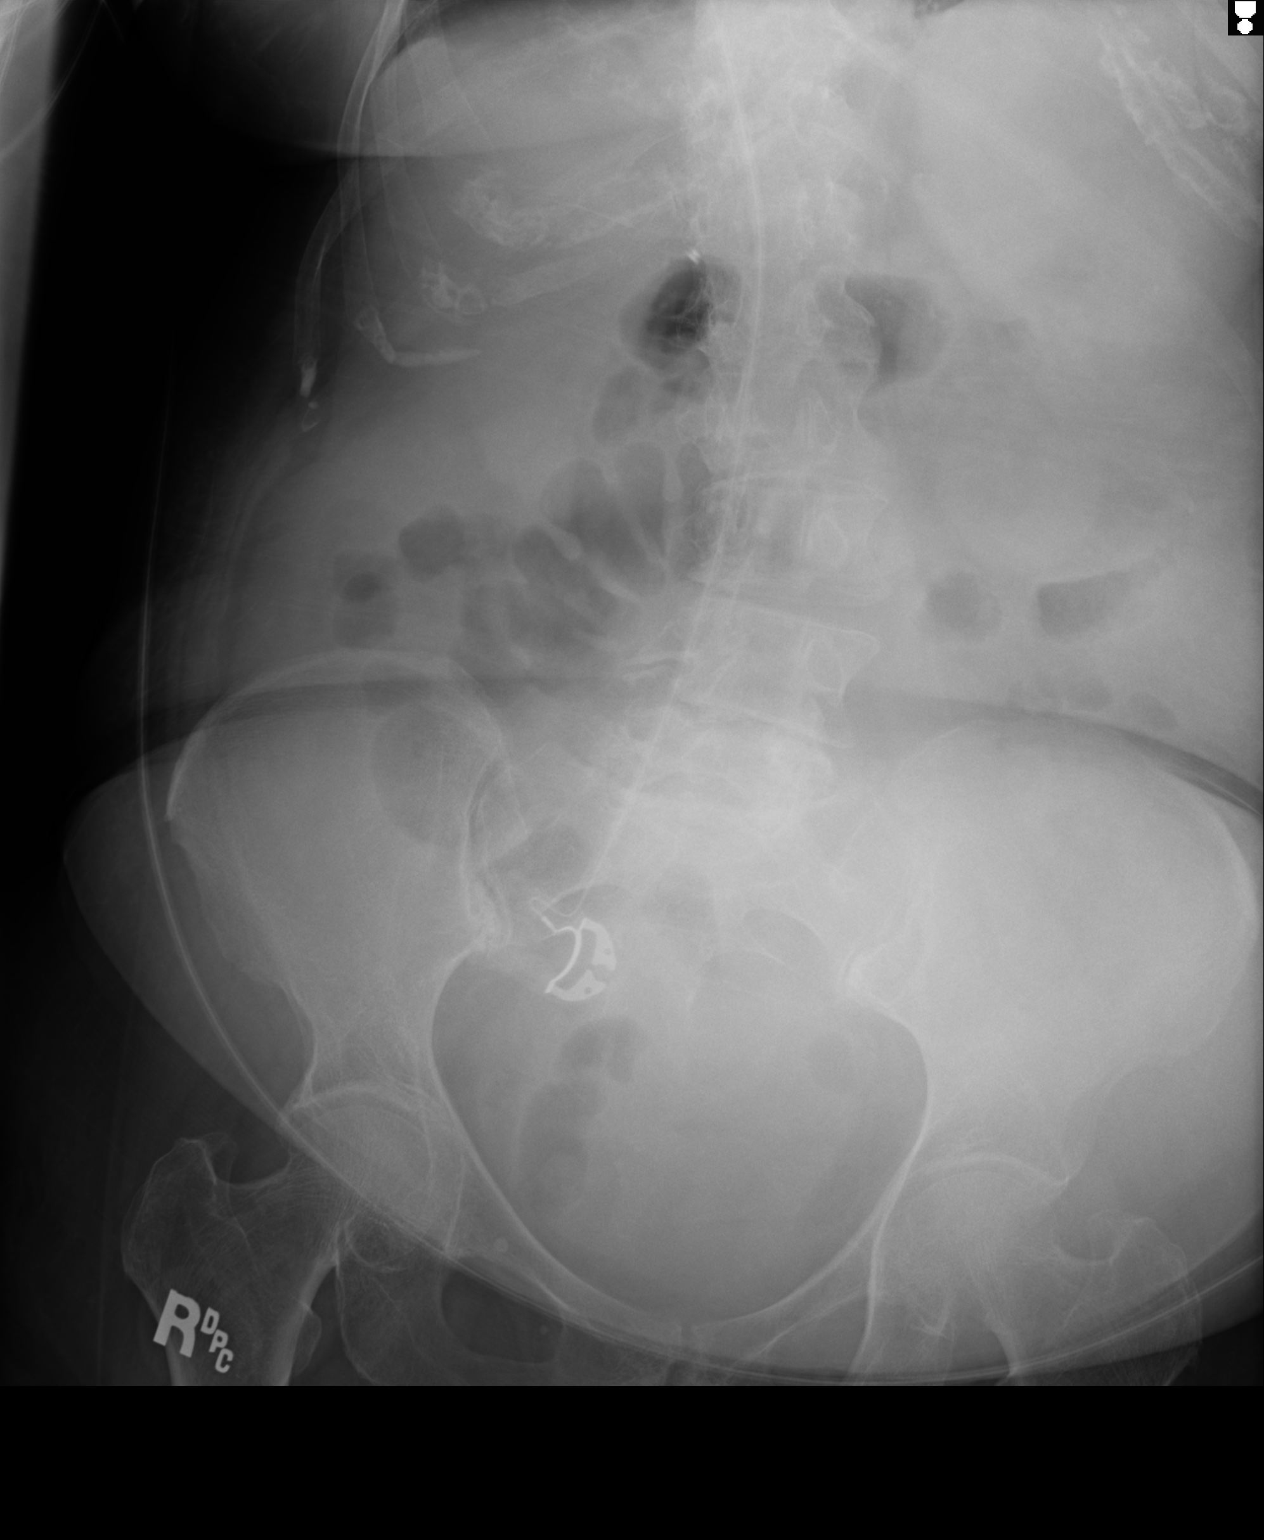

[3 of 3 positions shown; findings below may reference images not displayed]

FINDINGS: Single-view of the chest: Study is hypoinspiratory with crowding of
the perihilar and bibasilar bronchovascular markings. Suspect
superimposed atelectasis and/or small pleural effusions at each lung
base. Upper lungs are relatively clear.

Cardiomegaly appears stable. Atherosclerotic changes noted at the
aortic arch. Osseous structures about the chest are unremarkable.

Supine and upright views of the abdomen: Visualized bowel gas
pattern is nonobstructive. No evidence of soft tissue mass or
abnormal fluid collection. No evidence of free intraperitoneal air.
No acute or suspicious osseous finding.
IMPRESSION: 1. Low lung volumes. Probable atelectasis and/or small pleural
effusions at each lung base.
2. Nonobstructive bowel gas pattern and no evidence of acute
intra-abdominal abnormality.
3. Aortic atherosclerosis.

## 2017-11-25 IMAGING — US US PARACENTESIS
1 series · 9 of 9 positions shown · non-contrast
Comparison: none

INDICATION: Abdominal pain, mesenteric mass ,ascites, omental caking, elevated
CA 125; request made for diagnostic and therapeutic paracentesis.

[Series 1: us paracentesis · 0.26mm/px · 9 of 9 slices shown]
[im 1/9]
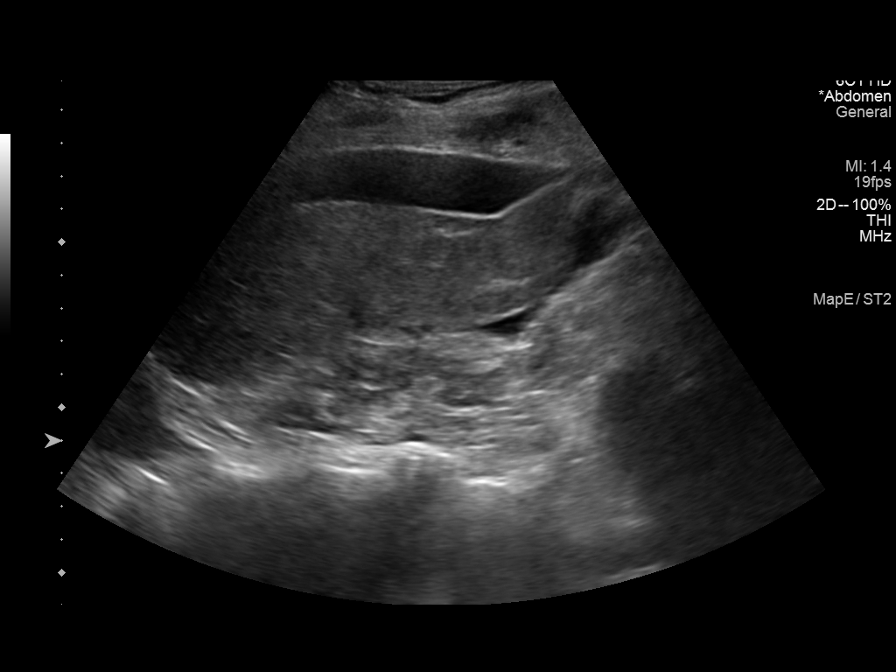
[im 2/9]
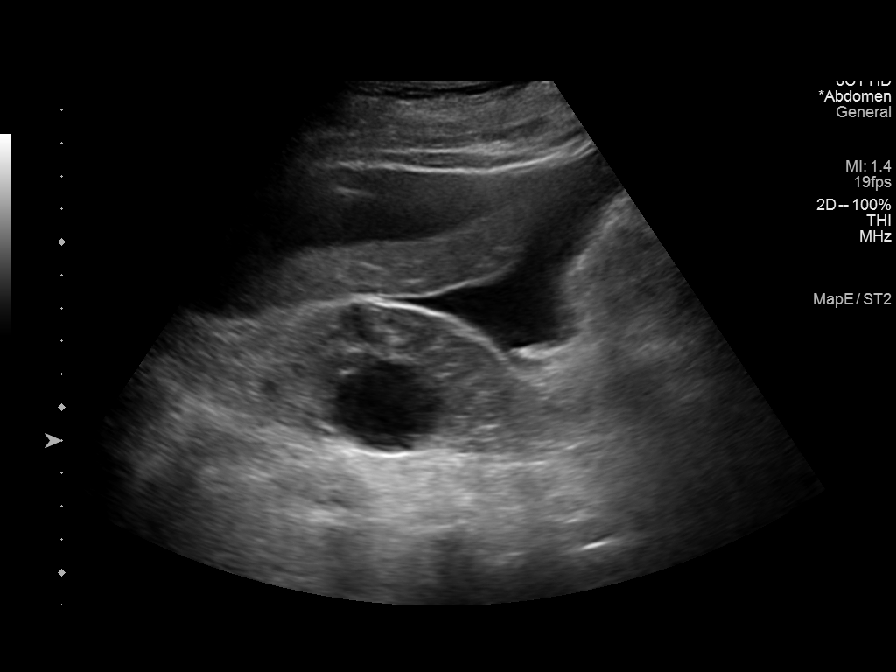
[im 3/9]
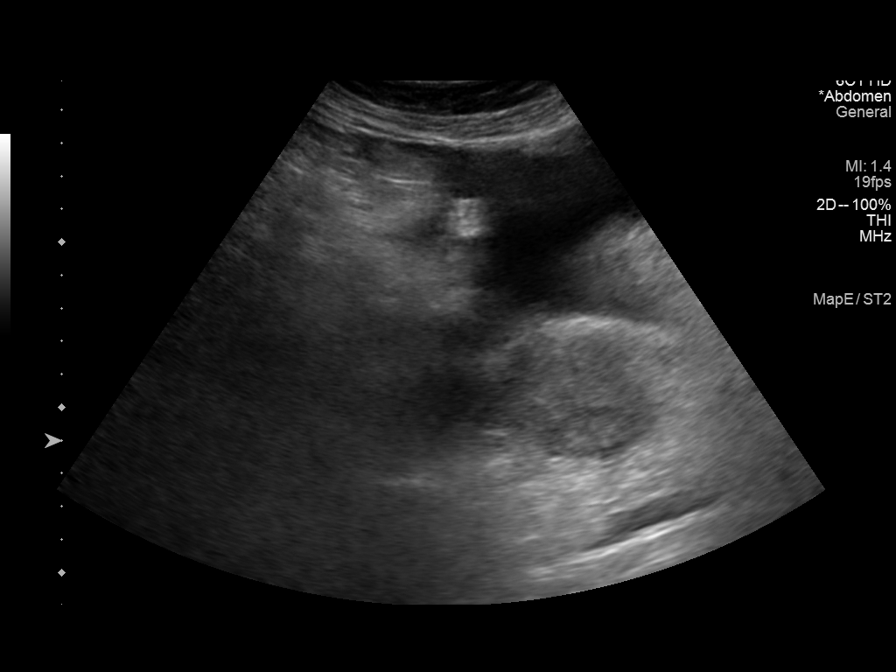
[im 4/9]
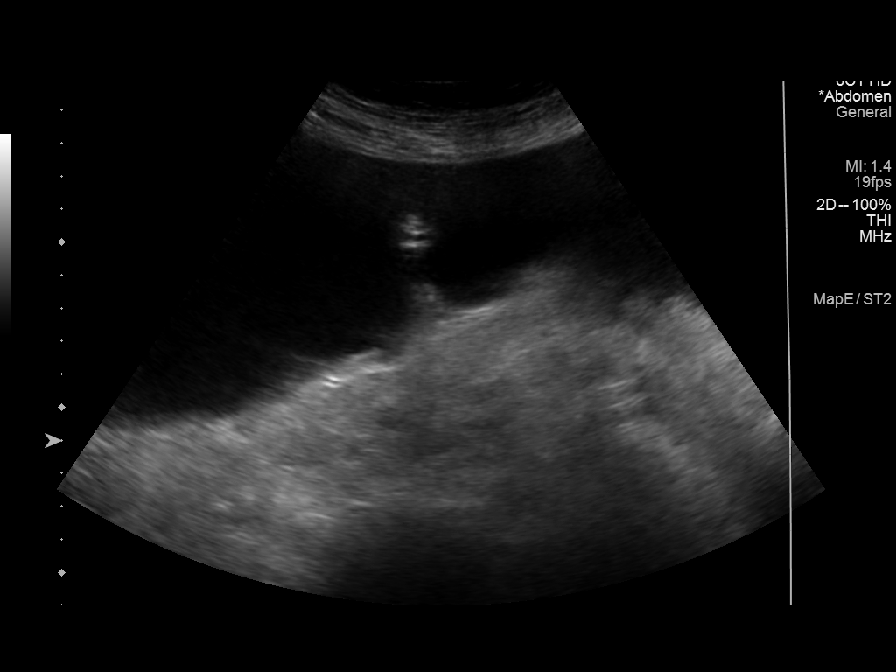
[im 5/9]
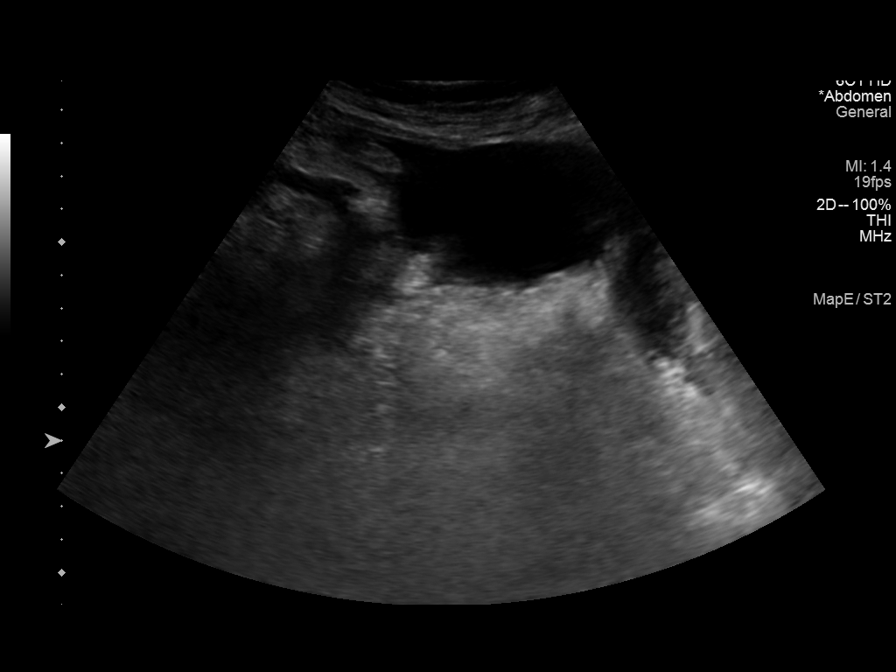
[im 6/9]
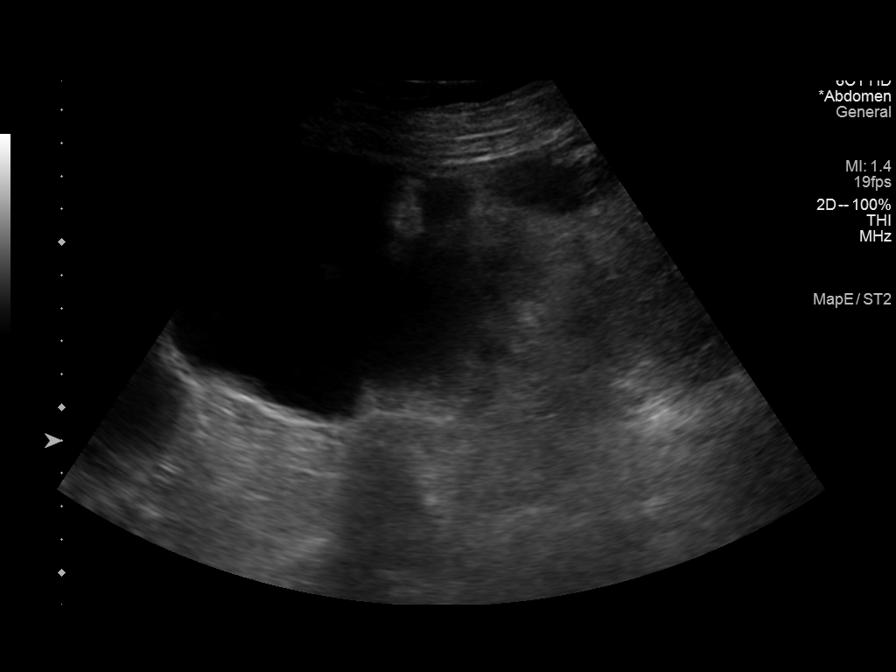
[im 7/9]
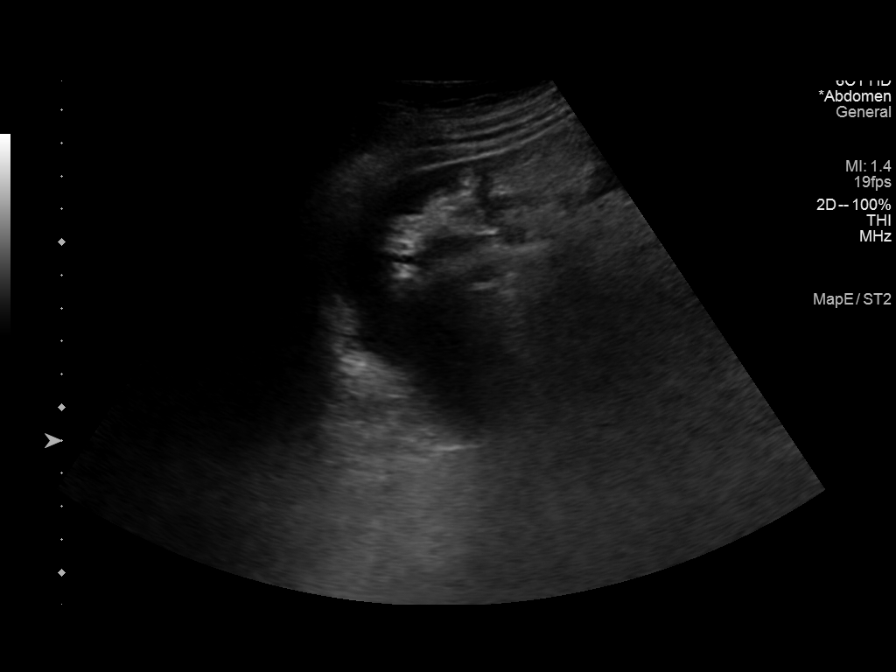
[im 8/9]
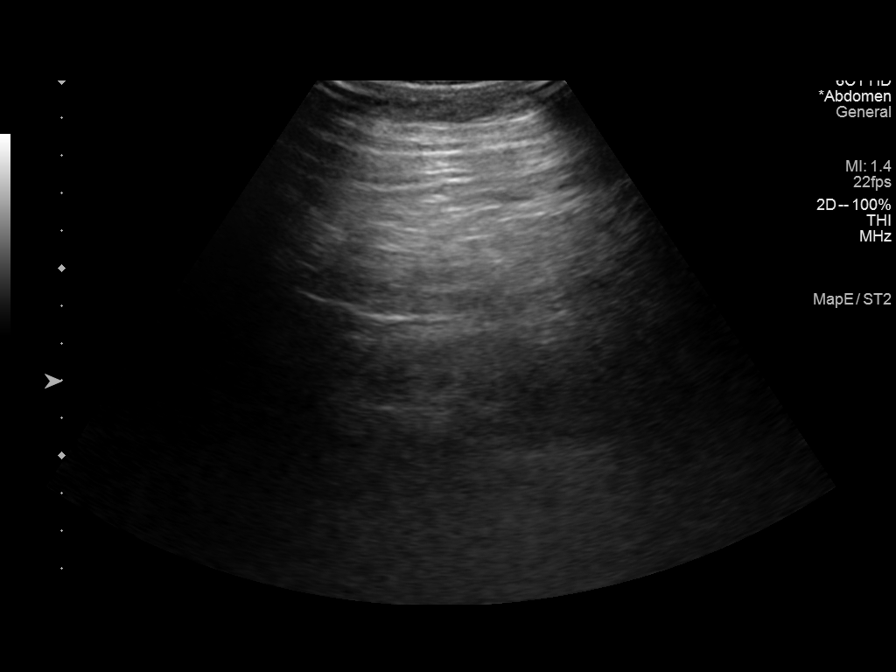
[im 9/9]
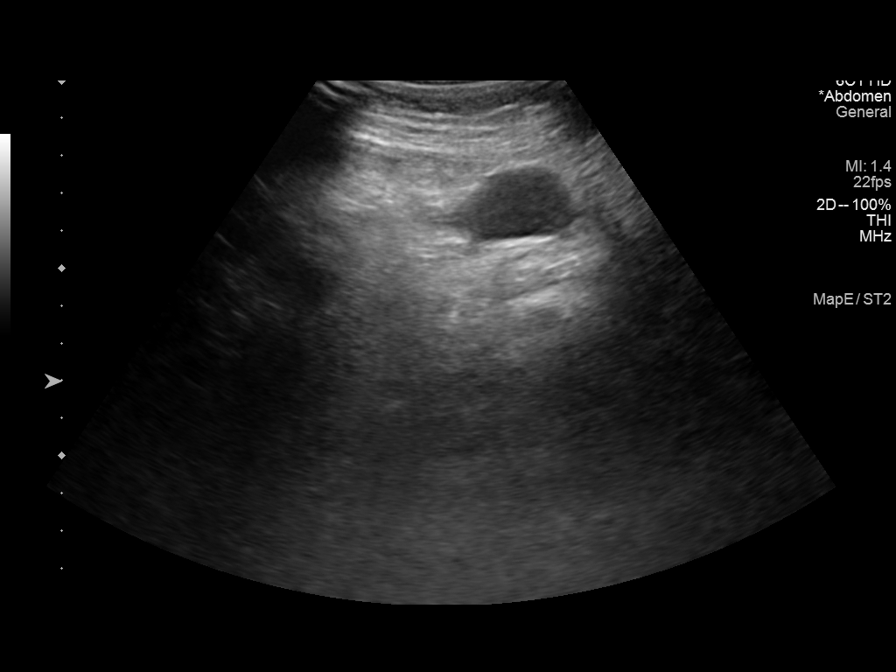

[9 of 9 positions shown; findings below may reference images not displayed]

EXAM:
ULTRASOUND GUIDED DIAGNOSTIC AND THERAPEUTIC PARACENTESIS

MEDICATIONS:
None.

COMPLICATIONS:
None immediate.

PROCEDURE:
Informed written consent was obtained from the patient after a
discussion of the risks, benefits and alternatives to treatment. A
timeout was performed prior to the initiation of the procedure.

Initial ultrasound scanning demonstrates a small to moderate amount
of ascites within the right lower abdominal quadrant. The right
lower abdomen was prepped and draped in the usual sterile fashion.
1% lidocaine was used for local anesthesia.

Following this, a Yueh catheter was introduced. An ultrasound image
was saved for documentation purposes. The paracentesis was
performed. The catheter was removed and a dressing was applied. The
patient tolerated the procedure well without immediate post
procedural complication.
FINDINGS: A total of approximately 2.5 liters of hazy, blood-tinged fluid was
removed. Samples were sent to the laboratory as requested by the
clinical team.
IMPRESSION: Successful ultrasound-guided diagnostic and therapeutic paracentesis
yielding 2.5 liters of peritoneal fluid.
